# Patient Record
Sex: Female | Born: 1980 | Race: White | Hispanic: Yes | Marital: Married | Smoking: Never smoker
Health system: Southern US, Community
[De-identification: ages and names within clinical notes are randomized; demographics above are authoritative.]

## PROBLEM LIST (undated history)

## (undated) DIAGNOSIS — M069 Rheumatoid arthritis, unspecified: Secondary | ICD-10-CM

## (undated) DIAGNOSIS — N200 Calculus of kidney: Secondary | ICD-10-CM

## (undated) DIAGNOSIS — M1990 Unspecified osteoarthritis, unspecified site: Secondary | ICD-10-CM

## (undated) HISTORY — PX: TUBAL LIGATION: SHX77

## (undated) HISTORY — DX: Rheumatoid arthritis, unspecified: M06.9

---

## 2006-04-13 ENCOUNTER — Ambulatory Visit: Payer: Self-pay | Admitting: Nurse Practitioner

## 2006-04-14 ENCOUNTER — Ambulatory Visit: Payer: Self-pay | Admitting: Nurse Practitioner

## 2006-04-15 ENCOUNTER — Ambulatory Visit: Payer: Self-pay | Admitting: *Deleted

## 2006-04-25 ENCOUNTER — Ambulatory Visit: Payer: Self-pay | Admitting: Nurse Practitioner

## 2006-05-03 ENCOUNTER — Ambulatory Visit: Payer: Self-pay | Admitting: Nurse Practitioner

## 2006-05-03 ENCOUNTER — Encounter (INDEPENDENT_AMBULATORY_CARE_PROVIDER_SITE_OTHER): Payer: Self-pay | Admitting: Nurse Practitioner

## 2006-07-04 ENCOUNTER — Ambulatory Visit: Payer: Self-pay | Admitting: Nurse Practitioner

## 2006-09-26 ENCOUNTER — Ambulatory Visit: Payer: Self-pay | Admitting: Nurse Practitioner

## 2006-11-30 ENCOUNTER — Encounter (INDEPENDENT_AMBULATORY_CARE_PROVIDER_SITE_OTHER): Payer: Self-pay | Admitting: *Deleted

## 2007-07-13 ENCOUNTER — Emergency Department (HOSPITAL_COMMUNITY): Admission: EM | Admit: 2007-07-13 | Discharge: 2007-07-13 | Payer: Self-pay | Admitting: Family Medicine

## 2007-10-05 ENCOUNTER — Ambulatory Visit: Payer: Self-pay | Admitting: Internal Medicine

## 2008-07-06 ENCOUNTER — Emergency Department (HOSPITAL_COMMUNITY): Admission: EM | Admit: 2008-07-06 | Discharge: 2008-07-06 | Payer: Self-pay | Admitting: Family Medicine

## 2008-12-11 ENCOUNTER — Emergency Department (HOSPITAL_COMMUNITY): Admission: EM | Admit: 2008-12-11 | Discharge: 2008-12-11 | Payer: Self-pay | Admitting: Family Medicine

## 2008-12-30 ENCOUNTER — Ambulatory Visit: Payer: Self-pay | Admitting: Internal Medicine

## 2008-12-30 ENCOUNTER — Encounter (INDEPENDENT_AMBULATORY_CARE_PROVIDER_SITE_OTHER): Payer: Self-pay | Admitting: Internal Medicine

## 2008-12-30 LAB — CONVERTED CEMR LAB
Albumin: 4.8 g/dL (ref 3.5–5.2)
Alkaline Phosphatase: 48 units/L (ref 39–117)
Anti Nuclear Antibody(ANA): POSITIVE — AB
Basophils Absolute: 0 10*3/uL (ref 0.0–0.1)
CO2: 22 meq/L (ref 19–32)
Calcium: 9.6 mg/dL (ref 8.4–10.5)
Eosinophils Relative: 1 % (ref 0–5)
Glucose, Bld: 100 mg/dL — ABNORMAL HIGH (ref 70–99)
HCT: 39.2 % (ref 36.0–46.0)
Lymphs Abs: 2.4 10*3/uL (ref 0.7–4.0)
MCV: 88.7 fL (ref 78.0–100.0)
Neutro Abs: 4.9 10*3/uL (ref 1.7–7.7)
Platelets: 243 10*3/uL (ref 150–400)
Potassium: 4.1 meq/L (ref 3.5–5.3)
Rhuematoid fact SerPl-aCnc: 64 intl units/mL — ABNORMAL HIGH (ref 0–20)
Total Bilirubin: 0.4 mg/dL (ref 0.3–1.2)
Total Protein: 7.7 g/dL (ref 6.0–8.3)
WBC: 7.9 10*3/uL (ref 4.0–10.5)

## 2009-01-07 ENCOUNTER — Ambulatory Visit (HOSPITAL_COMMUNITY): Admission: RE | Admit: 2009-01-07 | Discharge: 2009-01-07 | Payer: Self-pay | Admitting: Diagnostic Radiology

## 2009-03-21 ENCOUNTER — Emergency Department (HOSPITAL_COMMUNITY): Admission: EM | Admit: 2009-03-21 | Discharge: 2009-03-21 | Payer: Self-pay | Admitting: Emergency Medicine

## 2009-03-21 ENCOUNTER — Emergency Department (HOSPITAL_COMMUNITY): Admission: EM | Admit: 2009-03-21 | Discharge: 2009-03-22 | Payer: Self-pay | Admitting: Emergency Medicine

## 2009-03-24 ENCOUNTER — Encounter (INDEPENDENT_AMBULATORY_CARE_PROVIDER_SITE_OTHER): Payer: Self-pay | Admitting: Family Medicine

## 2009-03-24 ENCOUNTER — Ambulatory Visit: Payer: Self-pay | Admitting: Internal Medicine

## 2009-03-24 LAB — CONVERTED CEMR LAB
ANA Titer 1: NEGATIVE
Anti Nuclear Antibody(ANA): POSITIVE — AB
Rhuematoid fact SerPl-aCnc: 71 intl units/mL — ABNORMAL HIGH (ref 0–20)
Sed Rate: 36 mm/hr — ABNORMAL HIGH (ref 0–22)
TSH: 1.848 microintl units/mL (ref 0.350–4.500)

## 2009-03-30 ENCOUNTER — Emergency Department (HOSPITAL_COMMUNITY): Admission: EM | Admit: 2009-03-30 | Discharge: 2009-03-30 | Payer: Self-pay | Admitting: Emergency Medicine

## 2009-04-24 ENCOUNTER — Ambulatory Visit: Payer: Self-pay | Admitting: Internal Medicine

## 2009-05-12 ENCOUNTER — Ambulatory Visit: Payer: Self-pay | Admitting: Internal Medicine

## 2009-05-12 ENCOUNTER — Encounter (INDEPENDENT_AMBULATORY_CARE_PROVIDER_SITE_OTHER): Payer: Self-pay | Admitting: Family Medicine

## 2009-05-12 LAB — CONVERTED CEMR LAB
Basophils Relative: 0 % (ref 0–1)
Eosinophils Absolute: 0.1 10*3/uL (ref 0.0–0.7)
Eosinophils Relative: 1 % (ref 0–5)
HCT: 37.8 % (ref 36.0–46.0)
Lymphocytes Relative: 23 % (ref 12–46)
MCV: 92 fL (ref 78.0–100.0)
Neutro Abs: 5.5 10*3/uL (ref 1.7–7.7)
Platelets: 198 10*3/uL (ref 150–400)
RBC: 4.11 M/uL (ref 3.87–5.11)
WBC: 7.7 10*3/uL (ref 4.0–10.5)

## 2009-05-27 ENCOUNTER — Ambulatory Visit: Payer: Self-pay | Admitting: Internal Medicine

## 2009-06-09 ENCOUNTER — Ambulatory Visit: Payer: Self-pay | Admitting: Internal Medicine

## 2009-06-10 ENCOUNTER — Encounter (INDEPENDENT_AMBULATORY_CARE_PROVIDER_SITE_OTHER): Payer: Self-pay | Admitting: Family Medicine

## 2009-06-10 LAB — CONVERTED CEMR LAB
Basophils Absolute: 0 10*3/uL (ref 0.0–0.1)
Basophils Relative: 0 % (ref 0–1)
Eosinophils Absolute: 0.1 10*3/uL (ref 0.0–0.7)
HCT: 35 % — ABNORMAL LOW (ref 36.0–46.0)
Lymphocytes Relative: 27 % (ref 12–46)
Lymphs Abs: 2.1 10*3/uL (ref 0.7–4.0)
MCHC: 32.6 g/dL (ref 30.0–36.0)
Neutro Abs: 5.1 10*3/uL (ref 1.7–7.7)
Neutrophils Relative %: 66 % (ref 43–77)
RDW: 13.3 % (ref 11.5–15.5)

## 2009-06-27 ENCOUNTER — Encounter (INDEPENDENT_AMBULATORY_CARE_PROVIDER_SITE_OTHER): Payer: Self-pay | Admitting: Family Medicine

## 2009-06-27 ENCOUNTER — Ambulatory Visit: Payer: Self-pay | Admitting: Internal Medicine

## 2009-06-27 LAB — CONVERTED CEMR LAB
ALT: 8 units/L (ref 0–35)
Alkaline Phosphatase: 40 units/L (ref 39–117)
BUN: 11 mg/dL (ref 6–23)
Basophils Absolute: 0 10*3/uL (ref 0.0–0.1)
CO2: 23 meq/L (ref 19–32)
Chloride: 106 meq/L (ref 96–112)
Eosinophils Absolute: 0.1 10*3/uL (ref 0.0–0.7)
Glucose, Bld: 75 mg/dL (ref 70–99)
Lymphocytes Relative: 33 % (ref 12–46)
MCV: 90.4 fL (ref 78.0–100.0)
Neutro Abs: 4.2 10*3/uL (ref 1.7–7.7)
Potassium: 3.6 meq/L (ref 3.5–5.3)
RBC: 4.07 M/uL (ref 3.87–5.11)
RDW: 13 % (ref 11.5–15.5)
Sed Rate: 15 mm/hr (ref 0–22)
Sodium: 142 meq/L (ref 135–145)
Total Bilirubin: 0.4 mg/dL (ref 0.3–1.2)
Total Protein: 7.1 g/dL (ref 6.0–8.3)
UIBC: 263 ug/dL
WBC: 7.1 10*3/uL (ref 4.0–10.5)

## 2009-08-12 ENCOUNTER — Ambulatory Visit: Payer: Self-pay | Admitting: Family Medicine

## 2009-08-27 ENCOUNTER — Ambulatory Visit (HOSPITAL_COMMUNITY): Admission: RE | Admit: 2009-08-27 | Discharge: 2009-08-27 | Payer: Self-pay | Admitting: Family Medicine

## 2009-09-02 ENCOUNTER — Ambulatory Visit: Payer: Self-pay | Admitting: Internal Medicine

## 2009-09-02 ENCOUNTER — Encounter (INDEPENDENT_AMBULATORY_CARE_PROVIDER_SITE_OTHER): Payer: Self-pay | Admitting: Family Medicine

## 2009-09-02 LAB — CONVERTED CEMR LAB
Eosinophils Relative: 1 % (ref 0–5)
HCT: 40.3 % (ref 36.0–46.0)
HDL: 56 mg/dL (ref >39)
LDL Cholesterol: 63 mg/dL (ref 0–99)
Monocytes Relative: 6 % (ref 3–12)
Neutro Abs: 6 10*3/uL (ref 1.7–7.7)
Neutrophils Relative %: 73 % (ref 43–77)
Total CHOL/HDL Ratio: 2.3
Triglycerides: 44 mg/dL (ref <150)

## 2009-09-05 ENCOUNTER — Ambulatory Visit (HOSPITAL_COMMUNITY): Admission: RE | Admit: 2009-09-05 | Discharge: 2009-09-05 | Payer: Self-pay | Admitting: Family Medicine

## 2009-10-20 ENCOUNTER — Ambulatory Visit: Payer: Self-pay | Admitting: Internal Medicine

## 2009-10-20 ENCOUNTER — Encounter (INDEPENDENT_AMBULATORY_CARE_PROVIDER_SITE_OTHER): Payer: Self-pay | Admitting: Family Medicine

## 2009-10-20 LAB — CONVERTED CEMR LAB
Basophils Absolute: 0 10*3/uL (ref 0.0–0.1)
Basophils Relative: 0 % (ref 0–1)
Eosinophils Absolute: 0 10*3/uL (ref 0.0–0.7)
Hemoglobin: 12.6 g/dL (ref 12.0–15.0)
Lymphocytes Relative: 27 % (ref 12–46)
Lymphs Abs: 1.9 10*3/uL (ref 0.7–4.0)
MCV: 89.8 fL (ref 78.0–100.0)
Monocytes Absolute: 0.5 10*3/uL (ref 0.1–1.0)
Neutro Abs: 4.8 10*3/uL (ref 1.7–7.7)
Neutrophils Relative %: 66 % (ref 43–77)
RBC: 4.12 M/uL (ref 3.87–5.11)
RDW: 12.8 % (ref 11.5–15.5)
Sed Rate: 13 mm/hr (ref 0–22)
WBC: 7.2 10*3/uL (ref 4.0–10.5)

## 2010-03-12 ENCOUNTER — Ambulatory Visit: Payer: Self-pay | Admitting: Obstetrics and Gynecology

## 2010-03-24 ENCOUNTER — Encounter: Payer: Self-pay | Admitting: Family Medicine

## 2010-03-24 ENCOUNTER — Ambulatory Visit (HOSPITAL_COMMUNITY)
Admission: RE | Admit: 2010-03-24 | Discharge: 2010-03-24 | Payer: Self-pay | Source: Home / Self Care | Attending: Family Medicine | Admitting: Family Medicine

## 2010-04-02 ENCOUNTER — Ambulatory Visit
Admission: RE | Admit: 2010-04-02 | Discharge: 2010-04-02 | Payer: Self-pay | Source: Home / Self Care | Attending: Obstetrics and Gynecology | Admitting: Obstetrics and Gynecology

## 2010-04-02 ENCOUNTER — Encounter: Payer: Self-pay | Admitting: Obstetrics & Gynecology

## 2010-04-02 LAB — CONVERTED CEMR LAB: Anticardiolipin IgA: 5 (ref <22)

## 2010-04-03 ENCOUNTER — Encounter: Payer: Self-pay | Admitting: Obstetrics & Gynecology

## 2010-04-03 LAB — CONVERTED CEMR LAB: GC Probe Amp, Genital: NEGATIVE

## 2010-04-04 ENCOUNTER — Other Ambulatory Visit: Payer: Self-pay | Admitting: Obstetrics & Gynecology

## 2010-04-04 DIAGNOSIS — Z3689 Encounter for other specified antenatal screening: Secondary | ICD-10-CM

## 2010-04-06 LAB — POCT URINALYSIS DIPSTICK
Bilirubin Urine: NEGATIVE
Nitrite: NEGATIVE
Protein, ur: NEGATIVE mg/dL
Specific Gravity, Urine: 1.025 (ref 1.005–1.030)

## 2010-04-08 ENCOUNTER — Ambulatory Visit (HOSPITAL_COMMUNITY): Admission: RE | Admit: 2010-04-08 | Payer: Self-pay | Source: Home / Self Care | Admitting: Obstetrics & Gynecology

## 2010-04-15 ENCOUNTER — Ambulatory Visit (HOSPITAL_COMMUNITY)
Admission: RE | Admit: 2010-04-15 | Discharge: 2010-04-15 | Disposition: A | Discharge status: Home / Self Care | Payer: Self-pay | Source: Ambulatory Visit | Attending: Family Medicine | Admitting: Family Medicine

## 2010-04-23 ENCOUNTER — Other Ambulatory Visit: Payer: Self-pay

## 2010-04-23 ENCOUNTER — Encounter (INDEPENDENT_AMBULATORY_CARE_PROVIDER_SITE_OTHER): Payer: Self-pay | Admitting: *Deleted

## 2010-04-23 DIAGNOSIS — M069 Rheumatoid arthritis, unspecified: Secondary | ICD-10-CM

## 2010-04-23 DIAGNOSIS — O34219 Maternal care for unspecified type scar from previous cesarean delivery: Secondary | ICD-10-CM

## 2010-04-23 LAB — POCT URINALYSIS DIPSTICK
Hgb urine dipstick: NEGATIVE
Ketones, ur: NEGATIVE mg/dL
Specific Gravity, Urine: 1.02 (ref 1.005–1.030)
Urine Glucose, Fasting: NEGATIVE mg/dL
Urobilinogen, UA: 0.2 mg/dL (ref 0.0–1.0)

## 2010-05-05 ENCOUNTER — Encounter (HOSPITAL_COMMUNITY): Payer: Self-pay

## 2010-05-05 ENCOUNTER — Ambulatory Visit (HOSPITAL_COMMUNITY)
Admission: RE | Admit: 2010-05-05 | Discharge: 2010-05-05 | Disposition: A | Discharge status: Home / Self Care | Payer: Self-pay | Source: Ambulatory Visit | Attending: Obstetrics & Gynecology | Admitting: Obstetrics & Gynecology

## 2010-05-05 ENCOUNTER — Other Ambulatory Visit: Payer: Self-pay | Admitting: Obstetrics & Gynecology

## 2010-05-05 DIAGNOSIS — Z363 Encounter for antenatal screening for malformations: Secondary | ICD-10-CM | POA: Insufficient documentation

## 2010-05-05 DIAGNOSIS — M069 Rheumatoid arthritis, unspecified: Secondary | ICD-10-CM

## 2010-05-05 DIAGNOSIS — Z3689 Encounter for other specified antenatal screening: Secondary | ICD-10-CM

## 2010-05-05 DIAGNOSIS — Z1389 Encounter for screening for other disorder: Secondary | ICD-10-CM | POA: Insufficient documentation

## 2010-05-05 DIAGNOSIS — O358XX0 Maternal care for other (suspected) fetal abnormality and damage, not applicable or unspecified: Secondary | ICD-10-CM | POA: Insufficient documentation

## 2010-05-14 ENCOUNTER — Other Ambulatory Visit: Payer: Self-pay

## 2010-05-14 ENCOUNTER — Other Ambulatory Visit: Payer: Self-pay | Admitting: Family Medicine

## 2010-05-14 DIAGNOSIS — M069 Rheumatoid arthritis, unspecified: Secondary | ICD-10-CM

## 2010-05-14 DIAGNOSIS — O34219 Maternal care for unspecified type scar from previous cesarean delivery: Secondary | ICD-10-CM

## 2010-05-14 LAB — POCT URINALYSIS DIPSTICK
Nitrite: NEGATIVE
Urine Glucose, Fasting: NEGATIVE mg/dL
pH: 8 (ref 5.0–8.0)

## 2010-05-25 LAB — POCT URINALYSIS DIPSTICK
Bilirubin Urine: NEGATIVE
Glucose, UA: NEGATIVE mg/dL
Ketones, ur: NEGATIVE mg/dL
Nitrite: NEGATIVE
Protein, ur: NEGATIVE mg/dL
Urobilinogen, UA: 0.2 mg/dL (ref 0.0–1.0)
pH: 5.5 (ref 5.0–8.0)

## 2010-05-31 LAB — CBC
Hemoglobin: 11.7 g/dL — ABNORMAL LOW (ref 12.0–15.0)
Hemoglobin: 12.5 g/dL (ref 12.0–15.0)
MCHC: 34.5 g/dL (ref 30.0–36.0)
MCHC: 34.1 g/dL (ref 30.0–36.0)
MCV: 91 fL (ref 78.0–100.0)
Platelets: 173 10*3/uL (ref 150–400)
RBC: 3.71 MIL/uL — ABNORMAL LOW (ref 3.87–5.11)
WBC: 13.3 10*3/uL — ABNORMAL HIGH (ref 4.0–10.5)

## 2010-05-31 LAB — POCT I-STAT, CHEM 8
BUN: 6 mg/dL (ref 6–23)
Chloride: 104 mEq/L (ref 96–112)
HCT: 37 % (ref 36.0–46.0)
Sodium: 137 mEq/L (ref 135–145)

## 2010-05-31 LAB — POCT RAPID STREP A (OFFICE): Streptococcus, Group A Screen (Direct): POSITIVE — AB

## 2010-05-31 LAB — POCT URINALYSIS DIP (DEVICE)
Bilirubin Urine: NEGATIVE
Glucose, UA: NEGATIVE mg/dL
Ketones, ur: 80 mg/dL — AB
Ketones, ur: NEGATIVE mg/dL
Nitrite: NEGATIVE
Nitrite: NEGATIVE
Protein, ur: NEGATIVE mg/dL
Specific Gravity, Urine: 1.01 (ref 1.005–1.030)
Urobilinogen, UA: 1 mg/dL (ref 0.0–1.0)

## 2010-05-31 LAB — COMPREHENSIVE METABOLIC PANEL
CO2: 23 mEq/L (ref 19–32)
GFR calc non Af Amer: >60 mL/min (ref >60)
Glucose, Bld: 100 mg/dL — ABNORMAL HIGH (ref 70–99)
Potassium: 3.6 mEq/L (ref 3.5–5.1)
Sodium: 131 mEq/L — ABNORMAL LOW (ref 135–145)

## 2010-05-31 LAB — DIFFERENTIAL
Basophils Absolute: 0 10*3/uL (ref 0.0–0.1)
Basophils Absolute: 0 10*3/uL (ref 0.0–0.1)
Basophils Relative: 0 % (ref 0–1)
Basophils Relative: 0 % (ref 0–1)
Eosinophils Absolute: 0.1 10*3/uL (ref 0.0–0.7)
Eosinophils Absolute: 0.6 10*3/uL (ref 0.0–0.7)
Eosinophils Relative: 6 % — ABNORMAL HIGH (ref 0–5)
Lymphs Abs: 0.8 10*3/uL (ref 0.7–4.0)
Lymphs Abs: 1.1 10*3/uL (ref 0.7–4.0)
Monocytes Relative: 3 % (ref 3–12)
Neutro Abs: 11.9 10*3/uL — ABNORMAL HIGH (ref 1.7–7.7)
Neutro Abs: 7.2 10*3/uL (ref 1.7–7.7)
Neutrophils Relative %: 78 % — ABNORMAL HIGH (ref 43–77)

## 2010-05-31 LAB — POCT PREGNANCY, URINE
Preg Test, Ur: NEGATIVE
Preg Test, Ur: NEGATIVE

## 2010-05-31 LAB — LIPASE, BLOOD: Lipase: 25 U/L (ref 11–59)

## 2010-05-31 LAB — SEDIMENTATION RATE: Sed Rate: 40 mm/hr — ABNORMAL HIGH (ref 0–22)

## 2010-06-11 ENCOUNTER — Ambulatory Visit (HOSPITAL_COMMUNITY)
Admission: RE | Admit: 2010-06-11 | Discharge: 2010-06-11 | Disposition: A | Discharge status: Home / Self Care | Payer: Self-pay | Source: Ambulatory Visit | Attending: Family Medicine | Admitting: Family Medicine

## 2010-06-11 ENCOUNTER — Other Ambulatory Visit: Payer: Self-pay | Admitting: Obstetrics and Gynecology

## 2010-06-11 DIAGNOSIS — O34219 Maternal care for unspecified type scar from previous cesarean delivery: Secondary | ICD-10-CM

## 2010-06-11 DIAGNOSIS — Z3689 Encounter for other specified antenatal screening: Secondary | ICD-10-CM | POA: Insufficient documentation

## 2010-06-11 DIAGNOSIS — M069 Rheumatoid arthritis, unspecified: Secondary | ICD-10-CM

## 2010-06-11 DIAGNOSIS — Z331 Pregnant state, incidental: Secondary | ICD-10-CM

## 2010-06-11 LAB — POCT URINALYSIS DIP (DEVICE)
Glucose, UA: NEGATIVE mg/dL
Ketones, ur: NEGATIVE mg/dL
Protein, ur: NEGATIVE mg/dL
Specific Gravity, Urine: 1.025 (ref 1.005–1.030)
Urobilinogen, UA: 0.2 mg/dL (ref 0.0–1.0)

## 2010-06-16 ENCOUNTER — Ambulatory Visit (HOSPITAL_COMMUNITY): Payer: Self-pay

## 2010-06-19 LAB — POCT URINALYSIS DIP (DEVICE)
Glucose, UA: NEGATIVE mg/dL
Nitrite: NEGATIVE
Specific Gravity, Urine: >=1.03 (ref 1.005–1.030)
Urobilinogen, UA: 0.2 mg/dL (ref 0.0–1.0)

## 2010-06-19 LAB — POCT PREGNANCY, URINE: Preg Test, Ur: NEGATIVE

## 2010-06-25 ENCOUNTER — Other Ambulatory Visit: Payer: Self-pay

## 2010-06-25 ENCOUNTER — Other Ambulatory Visit: Payer: Self-pay | Admitting: Obstetrics & Gynecology

## 2010-06-29 ENCOUNTER — Other Ambulatory Visit: Payer: Self-pay

## 2010-06-30 LAB — POCT URINALYSIS DIP (DEVICE)
Bilirubin Urine: NEGATIVE
Glucose, UA: NEGATIVE mg/dL
Hgb urine dipstick: NEGATIVE
Nitrite: NEGATIVE
Urobilinogen, UA: 0.2 mg/dL (ref 0.0–1.0)

## 2010-07-09 DIAGNOSIS — O34219 Maternal care for unspecified type scar from previous cesarean delivery: Secondary | ICD-10-CM

## 2010-07-09 DIAGNOSIS — M069 Rheumatoid arthritis, unspecified: Secondary | ICD-10-CM

## 2010-07-09 DIAGNOSIS — Z331 Pregnant state, incidental: Secondary | ICD-10-CM

## 2010-07-15 ENCOUNTER — Inpatient Hospital Stay (HOSPITAL_COMMUNITY)
Admission: AD | Admit: 2010-07-15 | Discharge: 2010-07-15 | Disposition: A | Discharge status: Home / Self Care | Payer: Self-pay | Source: Ambulatory Visit | Attending: Obstetrics and Gynecology | Admitting: Obstetrics and Gynecology

## 2010-07-15 DIAGNOSIS — O99891 Other specified diseases and conditions complicating pregnancy: Secondary | ICD-10-CM | POA: Insufficient documentation

## 2010-07-15 DIAGNOSIS — M19019 Primary osteoarthritis, unspecified shoulder: Secondary | ICD-10-CM | POA: Insufficient documentation

## 2010-07-15 DIAGNOSIS — M25519 Pain in unspecified shoulder: Secondary | ICD-10-CM | POA: Insufficient documentation

## 2010-07-15 DIAGNOSIS — O9989 Other specified diseases and conditions complicating pregnancy, childbirth and the puerperium: Secondary | ICD-10-CM

## 2010-07-23 ENCOUNTER — Other Ambulatory Visit: Payer: Self-pay | Admitting: Obstetrics & Gynecology

## 2010-07-23 DIAGNOSIS — Z331 Pregnant state, incidental: Secondary | ICD-10-CM

## 2010-07-23 DIAGNOSIS — M069 Rheumatoid arthritis, unspecified: Secondary | ICD-10-CM

## 2010-07-23 DIAGNOSIS — O0930 Supervision of pregnancy with insufficient antenatal care, unspecified trimester: Secondary | ICD-10-CM

## 2010-07-23 LAB — POCT URINALYSIS DIP (DEVICE)
Bilirubin Urine: NEGATIVE
Glucose, UA: NEGATIVE mg/dL
Ketones, ur: NEGATIVE mg/dL
Nitrite: NEGATIVE
pH: 6.5 (ref 5.0–8.0)

## 2010-08-06 ENCOUNTER — Other Ambulatory Visit: Payer: Self-pay | Admitting: Obstetrics & Gynecology

## 2010-08-06 DIAGNOSIS — Z331 Pregnant state, incidental: Secondary | ICD-10-CM

## 2010-08-06 DIAGNOSIS — O34219 Maternal care for unspecified type scar from previous cesarean delivery: Secondary | ICD-10-CM

## 2010-08-06 DIAGNOSIS — M069 Rheumatoid arthritis, unspecified: Secondary | ICD-10-CM

## 2010-08-06 LAB — POCT URINALYSIS DIP (DEVICE)
Bilirubin Urine: NEGATIVE
Glucose, UA: NEGATIVE mg/dL
Ketones, ur: NEGATIVE mg/dL
Protein, ur: NEGATIVE mg/dL
Specific Gravity, Urine: 1.025 (ref 1.005–1.030)

## 2010-08-13 ENCOUNTER — Other Ambulatory Visit: Payer: Self-pay | Admitting: Obstetrics and Gynecology

## 2010-08-13 DIAGNOSIS — O34219 Maternal care for unspecified type scar from previous cesarean delivery: Secondary | ICD-10-CM

## 2010-08-13 DIAGNOSIS — M069 Rheumatoid arthritis, unspecified: Secondary | ICD-10-CM

## 2010-08-13 DIAGNOSIS — Z331 Pregnant state, incidental: Secondary | ICD-10-CM

## 2010-08-13 LAB — POCT URINALYSIS DIP (DEVICE)
Bilirubin Urine: NEGATIVE
Glucose, UA: NEGATIVE mg/dL
Ketones, ur: NEGATIVE mg/dL
Specific Gravity, Urine: 1.02 (ref 1.005–1.030)
Urobilinogen, UA: 0.2 mg/dL (ref 0.0–1.0)

## 2010-08-19 ENCOUNTER — Other Ambulatory Visit: Payer: Self-pay

## 2010-08-19 DIAGNOSIS — Z0189 Encounter for other specified special examinations: Secondary | ICD-10-CM

## 2010-08-27 ENCOUNTER — Other Ambulatory Visit: Payer: Self-pay | Admitting: Obstetrics & Gynecology

## 2010-08-27 DIAGNOSIS — O34219 Maternal care for unspecified type scar from previous cesarean delivery: Secondary | ICD-10-CM

## 2010-08-27 LAB — POCT URINALYSIS DIP (DEVICE)
Bilirubin Urine: NEGATIVE
Glucose, UA: NEGATIVE mg/dL
Ketones, ur: NEGATIVE mg/dL
Nitrite: NEGATIVE
pH: 6 (ref 5.0–8.0)

## 2010-09-02 ENCOUNTER — Other Ambulatory Visit: Payer: Self-pay

## 2010-09-02 DIAGNOSIS — Z0189 Encounter for other specified special examinations: Secondary | ICD-10-CM

## 2010-09-08 ENCOUNTER — Inpatient Hospital Stay (HOSPITAL_COMMUNITY)
Admission: EM | Admit: 2010-09-08 | Discharge: 2010-09-12 | DRG: 765 | Disposition: A | Discharge status: Home / Self Care | Payer: Medicaid Other | Source: Ambulatory Visit | Attending: Obstetrics and Gynecology | Admitting: Obstetrics and Gynecology

## 2010-09-08 DIAGNOSIS — O4290 Premature rupture of membranes, unspecified as to length of time between rupture and onset of labor, unspecified weeks of gestation: Principal | ICD-10-CM | POA: Diagnosis present

## 2010-09-08 DIAGNOSIS — O34219 Maternal care for unspecified type scar from previous cesarean delivery: Secondary | ICD-10-CM | POA: Diagnosis present

## 2010-09-08 DIAGNOSIS — Z302 Encounter for sterilization: Secondary | ICD-10-CM

## 2010-09-09 ENCOUNTER — Other Ambulatory Visit: Payer: Self-pay | Admitting: Obstetrics and Gynecology

## 2010-09-09 DIAGNOSIS — Z302 Encounter for sterilization: Secondary | ICD-10-CM

## 2010-09-09 DIAGNOSIS — O34219 Maternal care for unspecified type scar from previous cesarean delivery: Secondary | ICD-10-CM

## 2010-09-09 DIAGNOSIS — O0940 Supervision of pregnancy with grand multiparity, unspecified trimester: Secondary | ICD-10-CM

## 2010-09-09 DIAGNOSIS — O4290 Premature rupture of membranes, unspecified as to length of time between rupture and onset of labor, unspecified weeks of gestation: Secondary | ICD-10-CM

## 2010-09-09 LAB — URINALYSIS, ROUTINE W REFLEX MICROSCOPIC
Nitrite: NEGATIVE
Specific Gravity, Urine: 1.02 (ref 1.005–1.030)
Urobilinogen, UA: 0.2 mg/dL (ref 0.0–1.0)
pH: 6.5 (ref 5.0–8.0)

## 2010-09-09 LAB — URINE MICROSCOPIC-ADD ON

## 2010-09-09 LAB — CBC
HCT: 34.2 % — ABNORMAL LOW (ref 36.0–46.0)
MCH: 29.2 pg (ref 26.0–34.0)
MCV: 88.4 fL (ref 78.0–100.0)
RBC: 3.87 MIL/uL (ref 3.87–5.11)
WBC: 12.9 10*3/uL — ABNORMAL HIGH (ref 4.0–10.5)

## 2010-09-09 LAB — WET PREP, GENITAL: Yeast Wet Prep HPF POC: NONE SEEN

## 2010-09-09 LAB — GC/CHLAMYDIA PROBE AMP, GENITAL
Chlamydia, DNA Probe: NEGATIVE
GC Probe Amp, Genital: NEGATIVE

## 2010-09-10 LAB — CBC
Hemoglobin: 9.3 g/dL — ABNORMAL LOW (ref 12.0–15.0)
RBC: 3.16 MIL/uL — ABNORMAL LOW (ref 3.87–5.11)
WBC: 13.5 10*3/uL — ABNORMAL HIGH (ref 4.0–10.5)

## 2010-09-14 NOTE — Op Note (Addendum)
Patricia Tate, Patricia Tate    ACCOUNT NO.:  [id number]  MEDICAL RECORD NO.:  [id number]  LOCATION:  9136                          FACILITY:  [facility]  PHYSICIAN:  Catalina Antigua, MD     DATE OF BIRTH:  April 23, 1980  DATE OF PROCEDURE:  09/09/2010 DATE OF DISCHARGE:                              OPERATIVE REPORT   PREOPERATIVE DIAGNOSES: 1. Intrauterine pregnancy at 36 weeks and 4 days. 2. Preterm premature rupture of membranes. 3. Multiparity and desires permanent sterilization. 4. Previous cesarean section x2.  POSTOPERATIVE DIAGNOSES: 1. Intrauterine pregnancy at 36 weeks and 4 days. 2. Preterm premature rupture of membranes. 3. Multiparity and desires permanent sterilization. 4. Previous cesarean section x2.  PROCEDURE:  Repeat low-transverse cesarean section with bilateral tubal ligation via modified Pomeroy method with Pfannenstiel incision.  SURGEON:  Catalina Antigua, MD and Maryelizabeth Kaufmann, MD. ANESTHESIA:  Spinal.  FINDINGS:  A viable female infant in vertex presentation with clear amniotic fluid delivered by vacuum assistance.  Apgars were 8 and 9, weight was 5 pounds 14 ounces.  Normal uterus and adnexa bilaterally.  SPECIMENS:  Placenta that was sent to Labor and Delivery and bilateral tubal segments sent to pathology.  IV FLUIDS:  1500 mL.  URINE OUTPUT:  200 mL of clear urine at the end of procedure.  ESTIMATED BLOOD LOSS:  600 mL.  COMPLICATIONS:  None immediate.  INDICATIONS:  This is a 31 year old gravida 3, para 2-0-0-2 with history of previous C-section x2, who presented with a complaint of rupture of membranes.  Sterile vaginal exam showed some mild pooling, ferning was positive.  The patient was fingertip, thick, and high.  Fetal heart rate tracing was reactive and reassuring.  She was not having any contractions.  The patient was n.p.o. at 10 o'clock p.m. and she presented at midnight.  Her past medical history is otherwise notable for  rheumatoid arthritis for which she is on Plaquenil and prednisone.  Due to the patient's previous history of C-section x2 and preterm premature rupture of membranes, the patient was consented for repeat C-section with bilateral tubal ligation.  Risks, benefits, and alternatives were discussed with the patient, she consented with the assistance of an interpreter.  She did receive GBS prophylaxis perioperatively as well as antibiotics.  PROCEDURE NOTE IN DETAIL:  The patient was taken back to the operative suite where spinal anesthesia was placed.  After anesthesia was found to be adequate, the patient was placed in a dorsal supine position with a leftward tilt.  The patient was prepped and draped in normal sterile fashion.  Anesthesia was again tested and was found to be adequate.  A Pfannenstiel skin incision was then made with a scalpel and carried down through to the underlying fascia.  The fascia was then incised in the midline.  The fascial incision was then extended laterally with the Mayo scissors.  Superior aspect of the fascial incision was then grasped with a Kocher clamps, elevated, tented up, and the rectus muscles were then dissected off bluntly.  Attention was then turned inferiorly which in similar fashion, the inferior edge was grasped with Kocher clamps, elevated, tented up, and the rectus muscles were then dissected off bluntly.  The rectus muscles were then separated in the midline  manually.  The peritoneum was then entered bluntly.  An Alexis O ring retractor was then inserted into the abdomen.  The vesicouterine peritoneum was then identified and entered sharply with the Metzenbaum scissors and the bladder flap was then created digitally.  Lower uterine segment was then incised in a transverse fashion with the scalpel.  The incision was extended manually.  The infant was found to be in vertex presentation with clear amniotic fluid.  Infant was delivered with  the assistance of vacuum assistance, otherwise atraumatically.  Mouth and nose were bulb suctioned.  Cord was cut and clamped and infant was handed off to awaiting NICU.  Apgars were 8 and 9, weight was 5 pounds 14 ounces.  Placenta was delivered spontaneously intact with three- vessel cord.  The uterus was then cleared of all clots and debris.  The uterine incision was then repaired using an 0 Vicryl in a running locking fashion.  She did have some additional areas that needed 2 figure-of-eights for additional hemostasis, otherwise hemostasis was noted to be adequate.  Attention was then turned to bilateral adnexa wherein the left adnexa was brought to the incision.  The left fallopian tube was grasped with a Babcock clamp and in the isthmic region, using a modified Pomeroy method, it was ligated using a plain gut suture and additional hemostasis was obtained with the electrocautery.  Then in similar fashion, the left adnexa was returned to the abdomen.  The right adnexa was then brought to the incision, visualized.  The right fallopian tube was then grasped with a Babcock clamp, followed out to the fimbriae, and the right fallopian tube was then off to ligate using a modified Pomeroy method with plain gut.  An additional hemostasis was then obtained with the electrocautery.  Bilateral ovaries also appeared to be normal as well.  The Alexis O ring retractor was then removed. The peritoneum was then irrigated, cleared of all clots and debris.  The fascial incision was then closed using 0 Vicryl in a running fashion. Subcutaneous tissue was irrigated.  Hemostasis was adequate.  Skin was then closed with staples.  The patient tolerated the procedure well. Lap, needle, and sponge counts were correct x2.  The patient was taken back to recovery area in stable condition.    ______________________________ Maryelizabeth Kaufmann, MD   ______________________________ Catalina Antigua,  MD    LC/MEDQ  D:  09/09/2010  T:  09/09/2010  Job:  [id number]  Electronically Signed by Catalina Antigua  on 09/14/2010 01:20:02 PM Electronically Signed by Maryelizabeth Kaufmann MD on 09/22/2010 08:44:03 AM

## 2010-09-14 NOTE — Discharge Summary (Signed)
  NAMEDENETRIA, LUEVANOS    ACCOUNT NO.:  [id number]  MEDICAL RECORD NO.:  [id number]  LOCATION:  9136                          FACILITY:  [facility]  PHYSICIAN:  Catalina Antigua, MD     DATE OF BIRTH:  [birth year]  DATE OF ADMISSION:  09/08/2010 DATE OF DISCHARGE:  09/12/2010                              DISCHARGE SUMMARY   DISCHARGE DIAGNOSES:  Intrauterine pregnancy at 26 and 4 weeks, premature spontaneous rupture of membranes, rheumatoid arthritis.  PROCEDURES:  Low-transverse C-section, bilateral tubal ligation.  HOSPITAL COURSE:  A 30 year old female 8 and 4 admitted with premature rupture of membranes.  She desired bilateral tubal ligation as well as a C-section.  The patient was taken to the operating room.  A repeat low- transverse C-section was done as well as bilateral tubal ligation in a modified pomeroy method without any complications, minimal blood loss. A viable female was born with Apgar's of 8 and 9 with a weight of 5 and 14 ounces.  She did well postoperative day #1 with minimal pain.  Her abdomen was soft.  Fundus was firm.  Her vaginal bleeding steadily decreased.  She was continued on her prednisone and hydroxychloroquine for her rheumatoid arthritis.  By postoperative day #3, she was in a good state to be discharged home discharge.  DISCHARGE CONDITION:  Improved.  DISCHARGE FOLLOWUP:  Follow up with your PCP in 1 week.  Follow up with [facility] in 6 weeks for postpartum check.  She is breast-feeding.  DISCHARGE MEDICATIONS: 1. Vicodin 5/500 mg tabs 1-2 tablets every 4 hours p.r.n. pain. 2. Ibuprofen 600 mg tabs 1 p.o. q.6 h. p.r.n. pain. 3. Prednisone taper 20 mg tabs 1 p.o. daily for 2 weeks, then switch     to 15 mg daily until your clinic appointment at which time your     medications will be adjusted accordingly.  The patient will be     followed up by [facility] on Tuesday, September 15, 2010 for staple      removal.    ______________________________ Edd Arbour, MD   ______________________________ Catalina Antigua, MD   JO/MEDQ  D:  09/12/2010  T:  09/12/2010  Job:  [id number]  Electronically Signed by Edd Arbour MD on 09/14/2010 08:51:16 AM Electronically Signed by Catalina Antigua  on 09/14/2010 02:22:14 PM

## 2010-09-15 ENCOUNTER — Ambulatory Visit (HOSPITAL_COMMUNITY): Admission: RE | Admit: 2010-09-15 | Payer: Self-pay | Source: Ambulatory Visit

## 2010-09-21 ENCOUNTER — Encounter (HOSPITAL_COMMUNITY): Payer: Medicaid Other | Attending: Obstetrics and Gynecology

## 2010-09-21 ENCOUNTER — Inpatient Hospital Stay (HOSPITAL_COMMUNITY): Admission: RE | Admit: 2010-09-21 | Payer: Self-pay | Source: Ambulatory Visit

## 2010-09-21 ENCOUNTER — Other Ambulatory Visit (HOSPITAL_COMMUNITY): Payer: Self-pay

## 2010-09-23 ENCOUNTER — Encounter (HOSPITAL_COMMUNITY): Payer: Self-pay | Admitting: *Deleted

## 2010-09-23 ENCOUNTER — Inpatient Hospital Stay (HOSPITAL_COMMUNITY)
Admission: EM | Admit: 2010-09-23 | Discharge: 2010-09-23 | Disposition: A | Discharge status: Home / Self Care | Payer: Self-pay | Source: Ambulatory Visit | Attending: Obstetrics and Gynecology | Admitting: Obstetrics and Gynecology

## 2010-09-23 DIAGNOSIS — N939 Abnormal uterine and vaginal bleeding, unspecified: Secondary | ICD-10-CM

## 2010-09-23 HISTORY — DX: Unspecified osteoarthritis, unspecified site: M19.90

## 2010-09-23 LAB — CBC
HCT: 37.7 % (ref 36.0–46.0)
Hemoglobin: 12.4 g/dL (ref 12.0–15.0)
MCH: 29.2 pg (ref 26.0–34.0)
MCHC: 32.9 g/dL (ref 30.0–36.0)
MCV: 88.7 fL (ref 78.0–100.0)
Platelets: 249 10*3/uL (ref 150–400)
RBC: 4.25 MIL/uL (ref 3.87–5.11)
RDW: 15.4 % (ref 11.5–15.5)
WBC: 11.6 10*3/uL — ABNORMAL HIGH (ref 4.0–10.5)

## 2010-09-23 NOTE — ED Notes (Signed)
Dr. Campbell in to evaluate pt

## 2010-09-23 NOTE — Progress Notes (Signed)
09/23/2010 Patricia Tate   Interpreter  I assisted Patricia Pikes RN with discharge.

## 2010-09-23 NOTE — ED Notes (Signed)
Pt. States she has had C/S X 3. Had BTL with recent C/S. States her bleeding had almost stopped until now. States she is breast feeding and pumping, but states the past 3 days, "milk is not flowing." States she gets only about 2 oz. and baby is not satisfied. She is having to supplement. States she thinks it could be because of her arthritis medication. States her sister was taking the same med. and her milk supply decreased as well.

## 2010-09-23 NOTE — Progress Notes (Signed)
Pt states she had a cesarean section on 6-27. Has had no problems until this am at 0300 had a gush of blood. Had another at 0700 and 1100 with moderate size clots. Pt is having no abdominal pain but does have a headache.

## 2010-09-23 NOTE — ED Provider Notes (Signed)
Chief Complaint:  Vaginal Bleeding   Patricia Tate is  30 y.o. [phone] s/p RLTCS with BTL @36 + wks due to pprom 2 weeks ago on 09/09/2010 presenting with vaginal bleeding for one day.  She states she awoke this am at 0500 "soaked in blood," changed clothes, only to pass another clot at 0700 and 1000.  She states that it is getting slightly better.  She is currently breastfeeding but states having decreased milk production.  She is mildly constipated controlled on colace.  She denies any LOC with ?dizziness.  She does note a headache with some eye pain.    After surgery her bleeding stopped for 5 days then returned this am.   Obstetrical/Gynecological History: OB History    Grav Para Term Preterm Abortions TAB SAB Ect Mult Living   4 3 1 2      3       Past Medical History: Past Medical History  Diagnosis Date  . Arthritis     Past Surgical History: Past Surgical History  Procedure Date  . Cesarean section 09/09/10  . Tubal ligation 09/09/10    Family History: No family history on file.  Social History: History  Substance Use Topics  . Smoking status: Never Smoker   . Smokeless tobacco: Not on file  . Alcohol Use: No    Allergies: No Known Allergies  Prescriptions prior to admission  Medication Sig Dispense Refill  . docusate sodium (COLACE) 100 MG capsule Take 100 mg by mouth 2 (two) times daily as needed.        Marland Kitchen HYDROcodone-acetaminophen (VICODIN) 5-500 MG per tablet Take 1 tablet by mouth every 4 (four) hours as needed. For pain        . hydroxychloroquine (PLAQUENIL) 200 MG tablet Take 200 mg by mouth 3 (three) times daily.        Marland Kitchen ibuprofen (ADVIL,MOTRIN) 200 MG tablet Take 600 mg by mouth every 12 (twelve) hours as needed. For pain        . predniSONE (DELTASONE) 10 MG tablet Take 15 mg by mouth daily. As needed for arthritis      . Prenatal Multivit-Min-Fe-FA (PRENATAL PO) Take 1 tablet by mouth daily.          Review of Systems - Negative except per  HPI  Physical Exam   Blood pressure 110/73, pulse 85, temperature 98.3 F (36.8 C), temperature source Oral, resp. rate 16, height 5' 1.5" (1.562 m), weight 70.943 kg (156 lb 6.4 oz), currently breastfeeding.  General: General appearance - alert, well appearing, and in no distress Chest - clear to auscultation, no wheezes, rales or rhonchi, symmetric air entry Heart - normal rate, regular rhythm, normal S1, S2, no murmurs, rubs, clicks or gallops Abdomen - soft, nontender, nondistended, no masses or organomegaly Incision c/d/i well healed with steristrips in place Pelvic - VAGINA: vaginal bleeding notable in the vault-mod, CERVIX: os open with blood at the os, nontender, no lesions visible, normal bimanual exam Extremities - peripheral pulses normal, no pedal edema, no clubbing or cyanosis   Labs: No results found for this or any previous visit (from the past 24 hour(s)). Imaging Studies:  No results found.   Assessment: [id number] s/p RLTCS with BTL presenting with vaginal bleeding, currently suggestive of normal physiologic bleeding.    Plan:  Check CBC, ER warnings discussed with the patient and pending CBC results pt may receive iron po.  Keep appointment in clinic for postop check.   Rhoderick Farrel

## 2010-09-23 NOTE — ED Notes (Signed)
Patricia Tate in for interpretation.

## 2010-09-23 NOTE — Progress Notes (Signed)
09/23/2010 Livian Vanderbeck   Interpreter  I assisted Darl Pikes RN with MAU visit.

## 2010-09-28 ENCOUNTER — Encounter (HOSPITAL_COMMUNITY): Admission: RE | Payer: Self-pay | Source: Ambulatory Visit

## 2010-09-28 ENCOUNTER — Inpatient Hospital Stay (HOSPITAL_COMMUNITY): Admission: RE | Admit: 2010-09-28 | Payer: Self-pay | Source: Ambulatory Visit | Admitting: Obstetrics & Gynecology

## 2010-09-28 SURGERY — Surgical Case
Anesthesia: Spinal

## 2010-10-04 ENCOUNTER — Inpatient Hospital Stay (HOSPITAL_COMMUNITY): Admission: AD | Admit: 2010-10-04 | Payer: Self-pay | Source: Home / Self Care | Admitting: Obstetrics & Gynecology

## 2010-11-12 ENCOUNTER — Ambulatory Visit (INDEPENDENT_AMBULATORY_CARE_PROVIDER_SITE_OTHER): Payer: Self-pay | Admitting: Obstetrics and Gynecology

## 2010-11-12 ENCOUNTER — Encounter: Payer: Self-pay | Admitting: Obstetrics and Gynecology

## 2010-11-12 ENCOUNTER — Other Ambulatory Visit: Payer: Self-pay | Admitting: Obstetrics and Gynecology

## 2010-11-12 VITALS — BP 117/79 | HR 110 | Temp 97.6°F | Wt 156.1 lb

## 2010-11-12 DIAGNOSIS — M069 Rheumatoid arthritis, unspecified: Secondary | ICD-10-CM | POA: Insufficient documentation

## 2010-11-12 HISTORY — DX: Rheumatoid arthritis, unspecified: M06.9

## 2010-11-12 MED ORDER — POLYETHYLENE GLYCOL 3350 17 GM/SCOOP PO POWD: 17.0000 g | Freq: Every day | ORAL | Status: DC

## 2010-11-12 MED ORDER — POLYETHYLENE GLYCOL 3350 17 GM/SCOOP PO POWD: 17.0000 g | Freq: Every day | ORAL | Status: AC

## 2010-11-12 NOTE — Patient Instructions (Signed)
Take MiraLAX daily x2 for 10-12 days and then when necessary. Return in December for Pap smear

## 2010-11-12 NOTE — Progress Notes (Signed)
Subjective:     Patient ID: Melford Aase, female   DOB: 1980/10/18, 30 y.o.   MRN: [id number]  HPI patient is a 30 year old Spanish-speaking Hispanic female who delivered by cesarean section with postpartum tubal ligation on 09/09/2010. She's had no postpartum problems related to her pregnancy she does take plaque were no and prednisone for rheumatoid arthritis she's currently on 10 mg a day eventually going to reduce the 5 mg a day and being followed by rheumatologist. Leanora Ivanoff to practically no pills per day. Her only postpartum problem is chronic constipation Colace has not helped with a try her on MiraLAX given her a prescription for that. Her abdomen wound is well healed and she does not need a Pap smear until December of 12.   Review of Systems     Objective:   Physical Exam     Assessment:         Plan:    try to control constipation with MiraLAX. Return in December for Pap smear.

## 2011-02-19 IMAGING — CR DG CHEST 2V
2 series · 2 of 2 positions shown · non-contrast
Comparison: None

CLINICAL DATA: Cough and cold symptoms.

CHEST - 2 VIEW

[w chest pa]
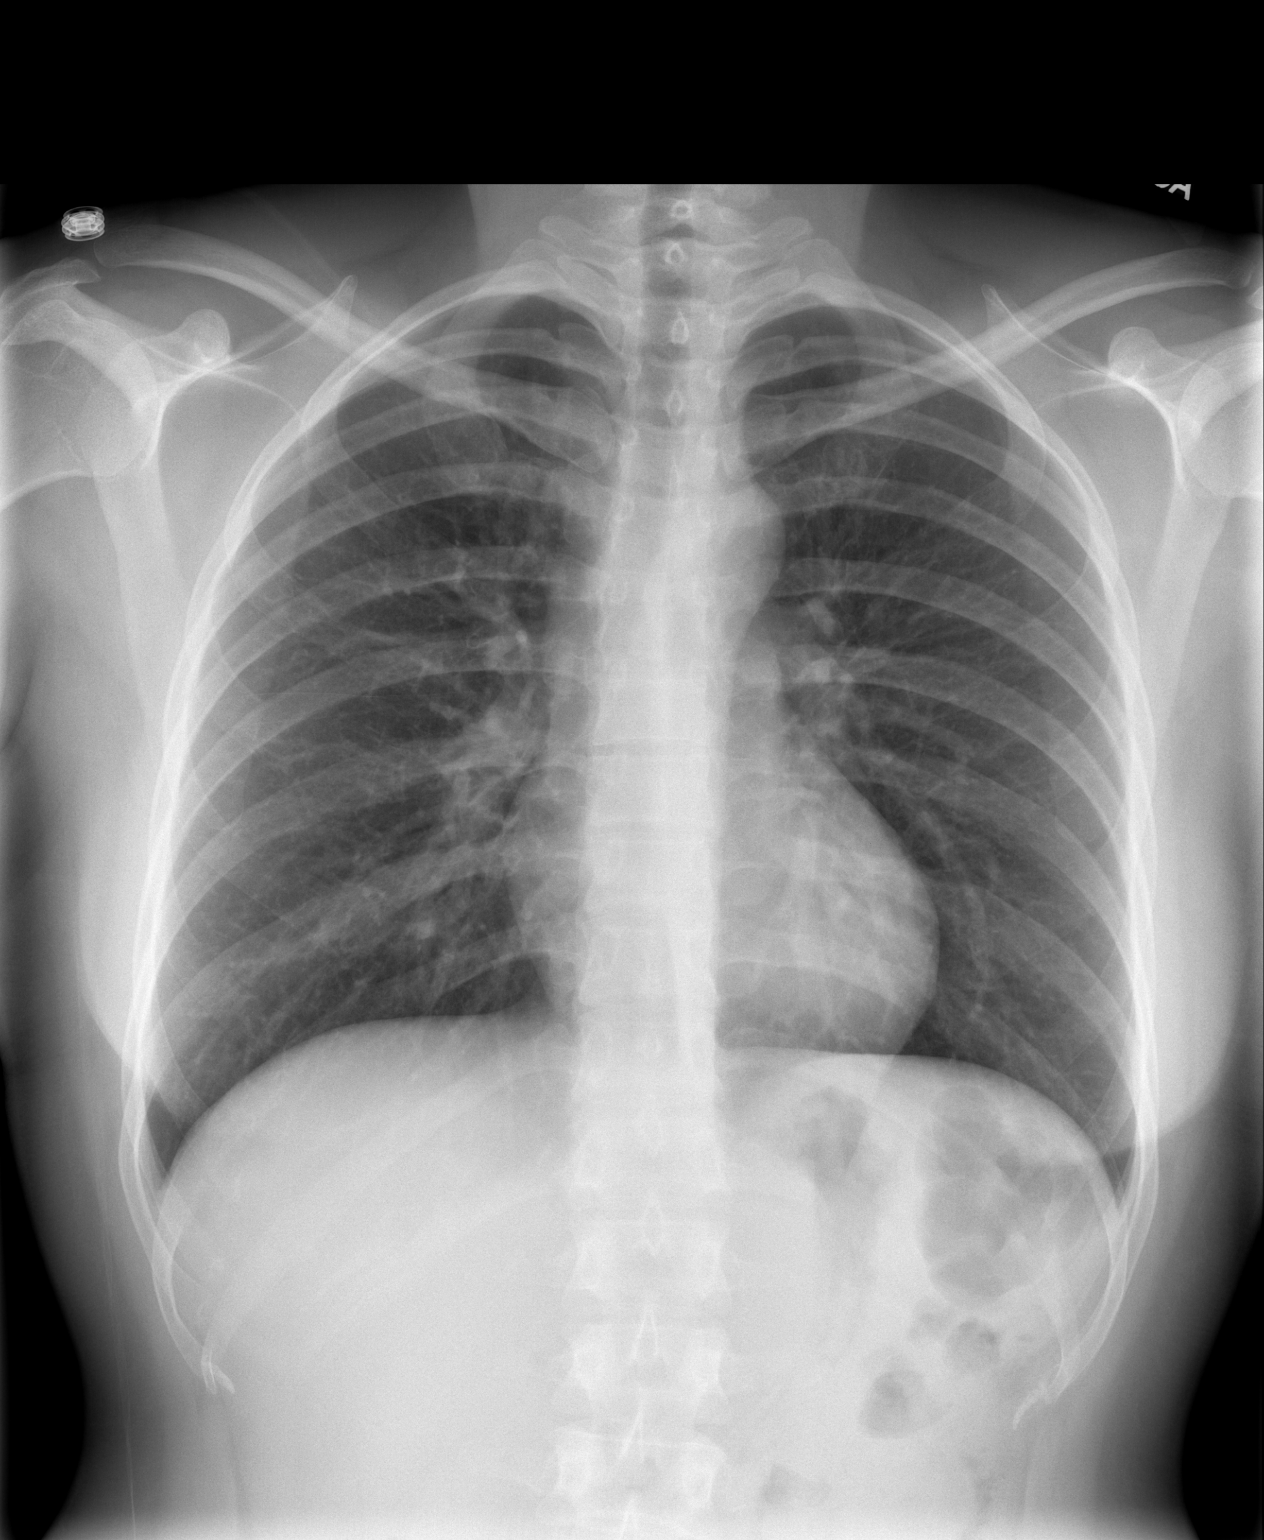

[w chest lat]
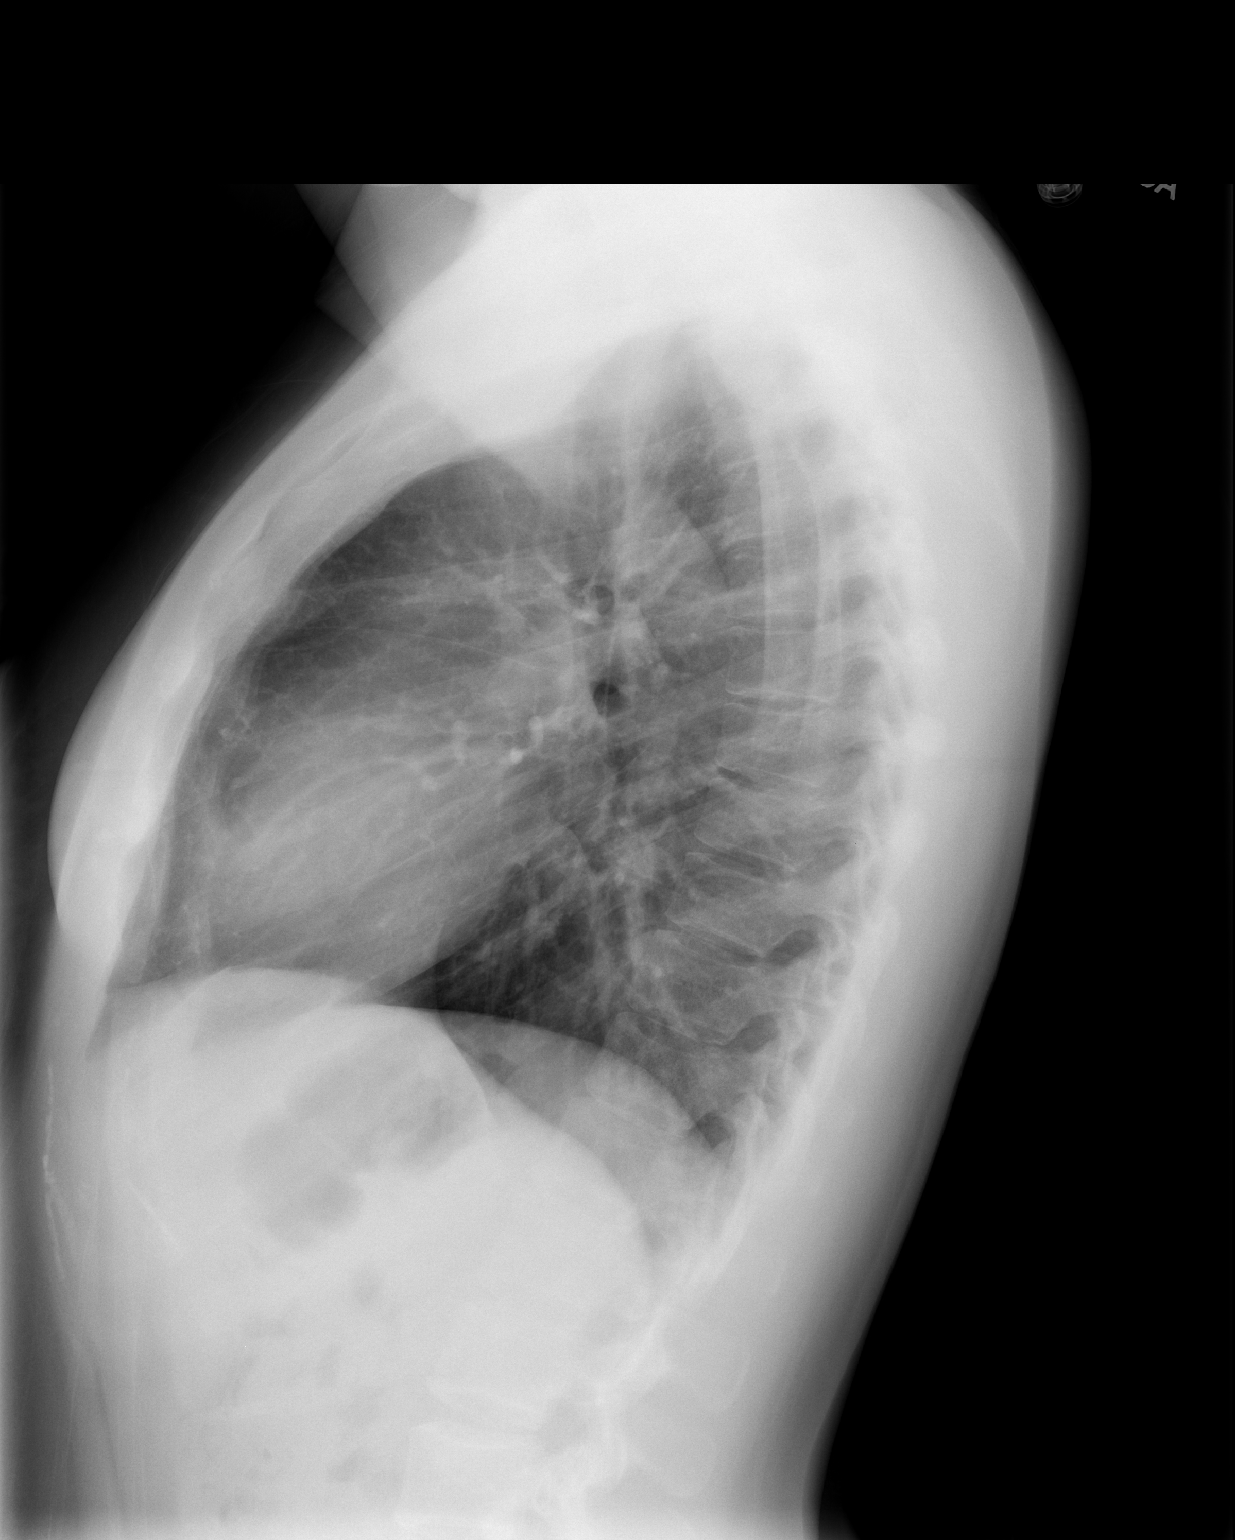

[2 of 2 positions shown; findings below may reference images not displayed]

FINDINGS: The cardiac silhouette, mediastinal and hilar contours
are within normal limits.  There is mild peribronchial thickening
and increased interstitial markings which could reflect bronchitis.
No infiltrates, edema or effusions.  The bony thorax is intact.
IMPRESSION: Mild bronchitic changes.  No infiltrates, edema or effusions.

## 2011-05-01 ENCOUNTER — Emergency Department (HOSPITAL_COMMUNITY)
Admission: EM | Admit: 2011-05-01 | Discharge: 2011-05-01 | Disposition: A | Discharge status: Home / Self Care | Payer: Self-pay | Source: Home / Self Care | Attending: Emergency Medicine | Admitting: Emergency Medicine

## 2011-05-01 ENCOUNTER — Encounter (HOSPITAL_COMMUNITY): Payer: Self-pay | Admitting: Emergency Medicine

## 2011-05-01 DIAGNOSIS — M069 Rheumatoid arthritis, unspecified: Secondary | ICD-10-CM

## 2011-05-01 MED ORDER — PREDNISONE 5 MG PO TABS: 20.0000 mg | ORAL_TABLET | Freq: Every day | ORAL | Status: DC

## 2011-05-01 MED ORDER — HYDROCODONE-ACETAMINOPHEN 5-500 MG PO TABS: 1.0000 | ORAL_TABLET | Freq: Four times a day (QID) | ORAL | Status: DC | PRN

## 2011-05-01 NOTE — ED Notes (Signed)
Reports joint pain, wrists, feet, neck pain. History of the same

## 2011-05-01 NOTE — ED Provider Notes (Signed)
History     CSN: [id number]  Arrival date & time 05/01/11  1345   First MD Initiated Contact with Patient 05/01/11 1501      Chief Complaint  Patient presents with  . Joint Pain    (Consider location/radiation/quality/duration/timing/severity/associated sxs/prior treatment) HPI Comments: Patient presents urgent care today complaining of generalize joint pains or predominantly both feet both wrists and fingers (points to metacarpophalangeal joints). Patient describes that she ran out of prednisone last Monday she was instructed by her rheumatologist to decrease dose to 2.5 which she attempted and noticed that mild discomfort started to sit in which she felt was her arthritis becoming active again. As she can take more prednisone since Monday she started noticing pain became more severe started affecting more joints been feeling fatigue and tired and was using Motrin left over from previous prescription to control pain. Describes that for the last 2 days pain is just becoming too severe to tolerate any further and she feels that she needs to take some prednisone and she will contact her rheumatologist tomorrow. She reports that yesterday she felt that she might have had a fever but denies any other symptoms respiratory gastrointestinal or dysuria.  Patient is a 31 y.o. female presenting with lower extremity pain.  Foot Pain The current episode started more than 2 days ago. The problem occurs constantly. The problem has been gradually worsening. Pertinent negatives include no abdominal pain, no headaches and no shortness of breath. The symptoms are relieved by walking. Treatments tried: motrin.    Past Medical History  Diagnosis Date  . Arthritis   . Rheumatoid arthritis of the hand 11/12/2010    Past Surgical History  Procedure Date  . Cesarean section   . Tubal ligation     No family history on file.  History  Substance Use Topics  . Smoking status: Never Smoker   . Smokeless  tobacco: Not on file  . Alcohol Use: No    OB History    Grav Para Term Preterm Abortions TAB SAB Ect Mult Living   4 3 1 2      3       Review of Systems  Constitutional: Positive for chills, appetite change and fatigue.  Respiratory: Negative for shortness of breath.   Gastrointestinal: Negative for abdominal pain.  Genitourinary: Negative for flank pain.  Musculoskeletal: Positive for joint swelling, arthralgias and gait problem.  Neurological: Negative for dizziness and headaches.    Allergies  Review of patient's allergies indicates no known allergies.  Home Medications   Current Outpatient Rx  Name Route Sig Dispense Refill  . DOCUSATE SODIUM 100 MG PO CAPS Oral Take 100 mg by mouth 2 (two) times daily as needed.     Marland Kitchen HYDROCODONE-ACETAMINOPHEN 5-500 MG PO TABS Oral Take 1 tablet by mouth every 4 (four) hours as needed. For pain      . HYDROXYCHLOROQUINE SULFATE 200 MG PO TABS Oral Take 200 mg by mouth 3 (three) times daily.      . IBUPROFEN 200 MG PO TABS Oral Take 600 mg by mouth every 12 (twelve) hours as needed. For pain      . PREDNISONE 10 MG PO TABS Oral Take 15 mg by mouth daily. As needed for arthritis    . PRENATAL PO Oral Take 1 tablet by mouth daily.        BP 117/77  Pulse 72  Temp(Src) 97.9 F (36.6 C) (Oral)  Resp 20  SpO2 100%  Physical Exam  Constitutional: She appears well-developed. No distress.  HENT:  Head: Normocephalic.  Eyes: Conjunctivae are normal. Right eye exhibits no discharge. Left eye exhibits discharge.  Neck: Normal range of motion.  Neurological: She is alert.  Skin: She is not diaphoretic. No erythema.       ED Course  Procedures (including critical care time)  Labs Reviewed - No data to display No results found.   No diagnosis found.    MDM  Rheumatoid arthritis exacerbation patient ran out of prednisone about 6 days ago. Rheumatologist with sickle cell been trying to titrate prednisone down to her last  encounter as per patient report in November was told to take 2.5 mg daily. She attempted to call rheumatologist last week unable to obtain a refill of her prednisone and patient reports did not receive a call back. Patient describes polyarthralgias mainly to both of her feet wrist metacarpophalangeal joints and left shoulder.        Jimmie Molly, MD 05/01/11 [phone]

## 2011-05-01 NOTE — Discharge Instructions (Signed)
Empieze a tomar 5 mg de prednisona, y llame a su rheumatologo manana como discutimos Recruitment consultant o es posible que quieran verla-

## 2011-07-28 IMAGING — CR DG SHOULDER 2+V*R*
3 series · 3 of 3 positions shown · non-contrast
Comparison: None.

CLINICAL DATA: Right neck and shoulder pain.

RIGHT SHOULDER - 2+ VIEW

[w shoulder ap internal righ]
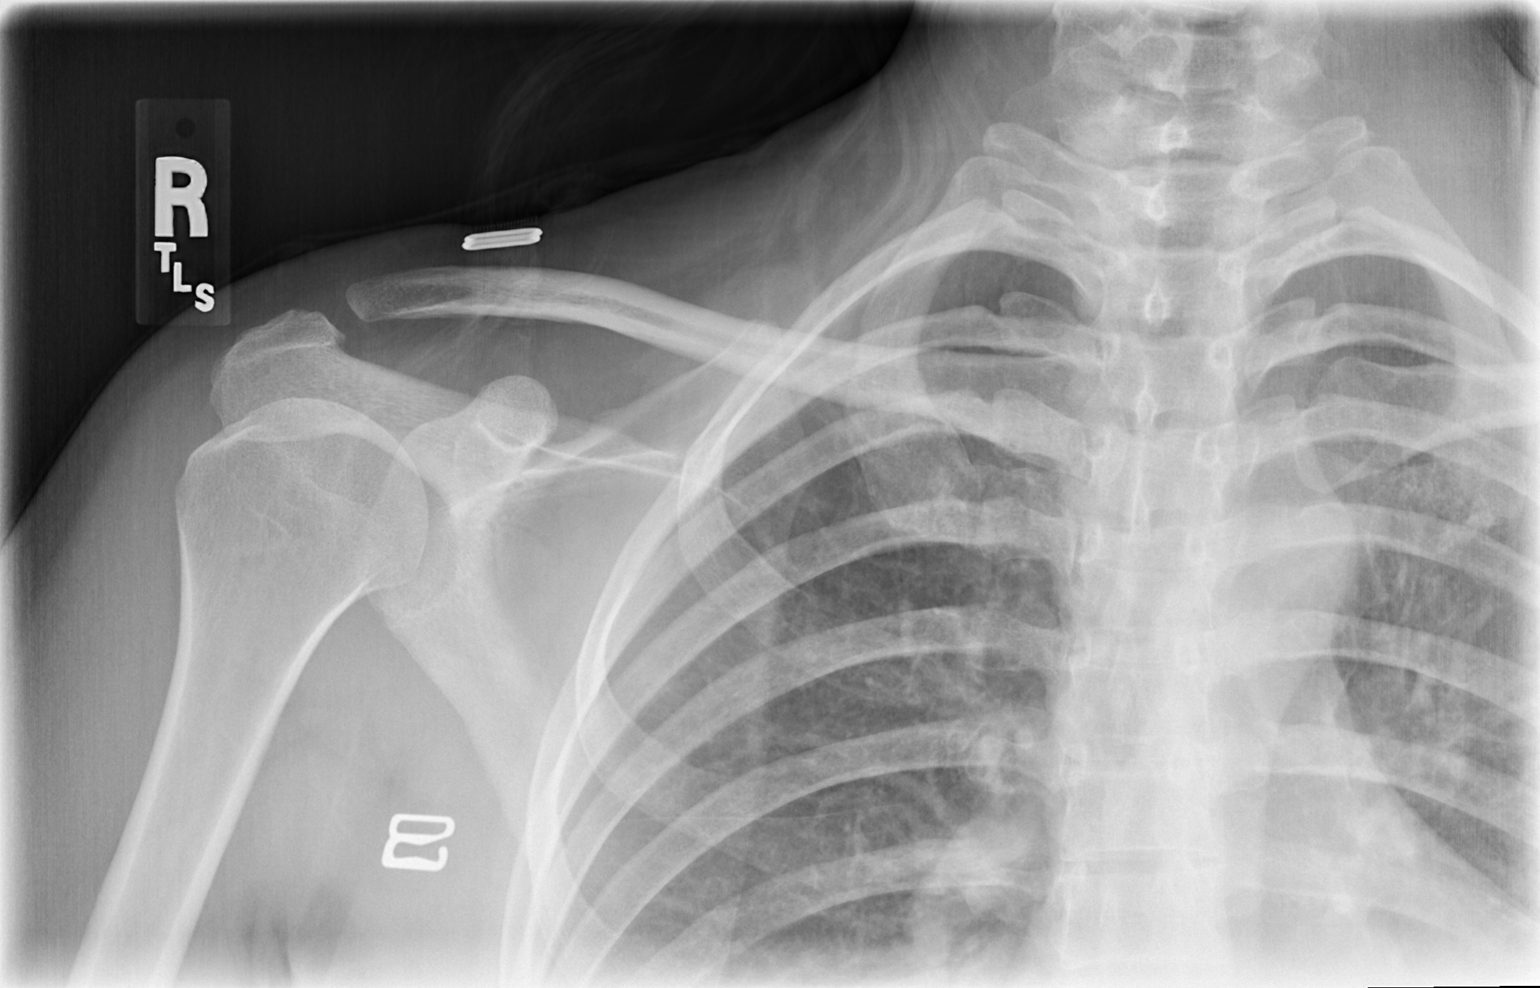

[w shoulder ap external righ]
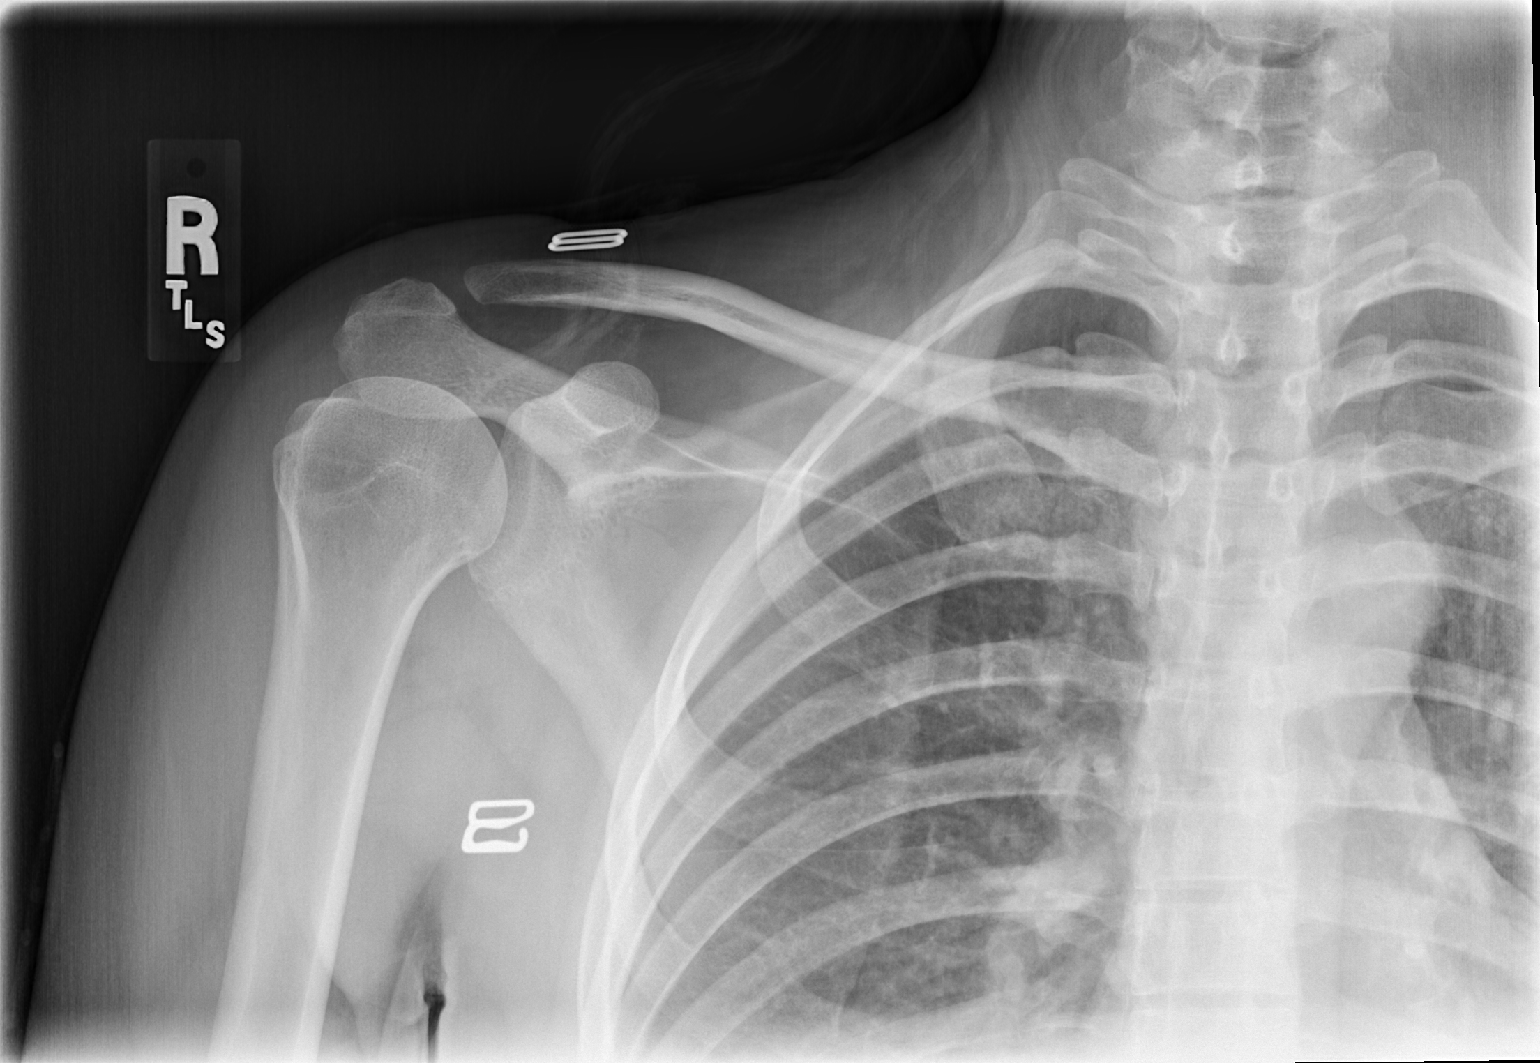

[w shoulder y view right]
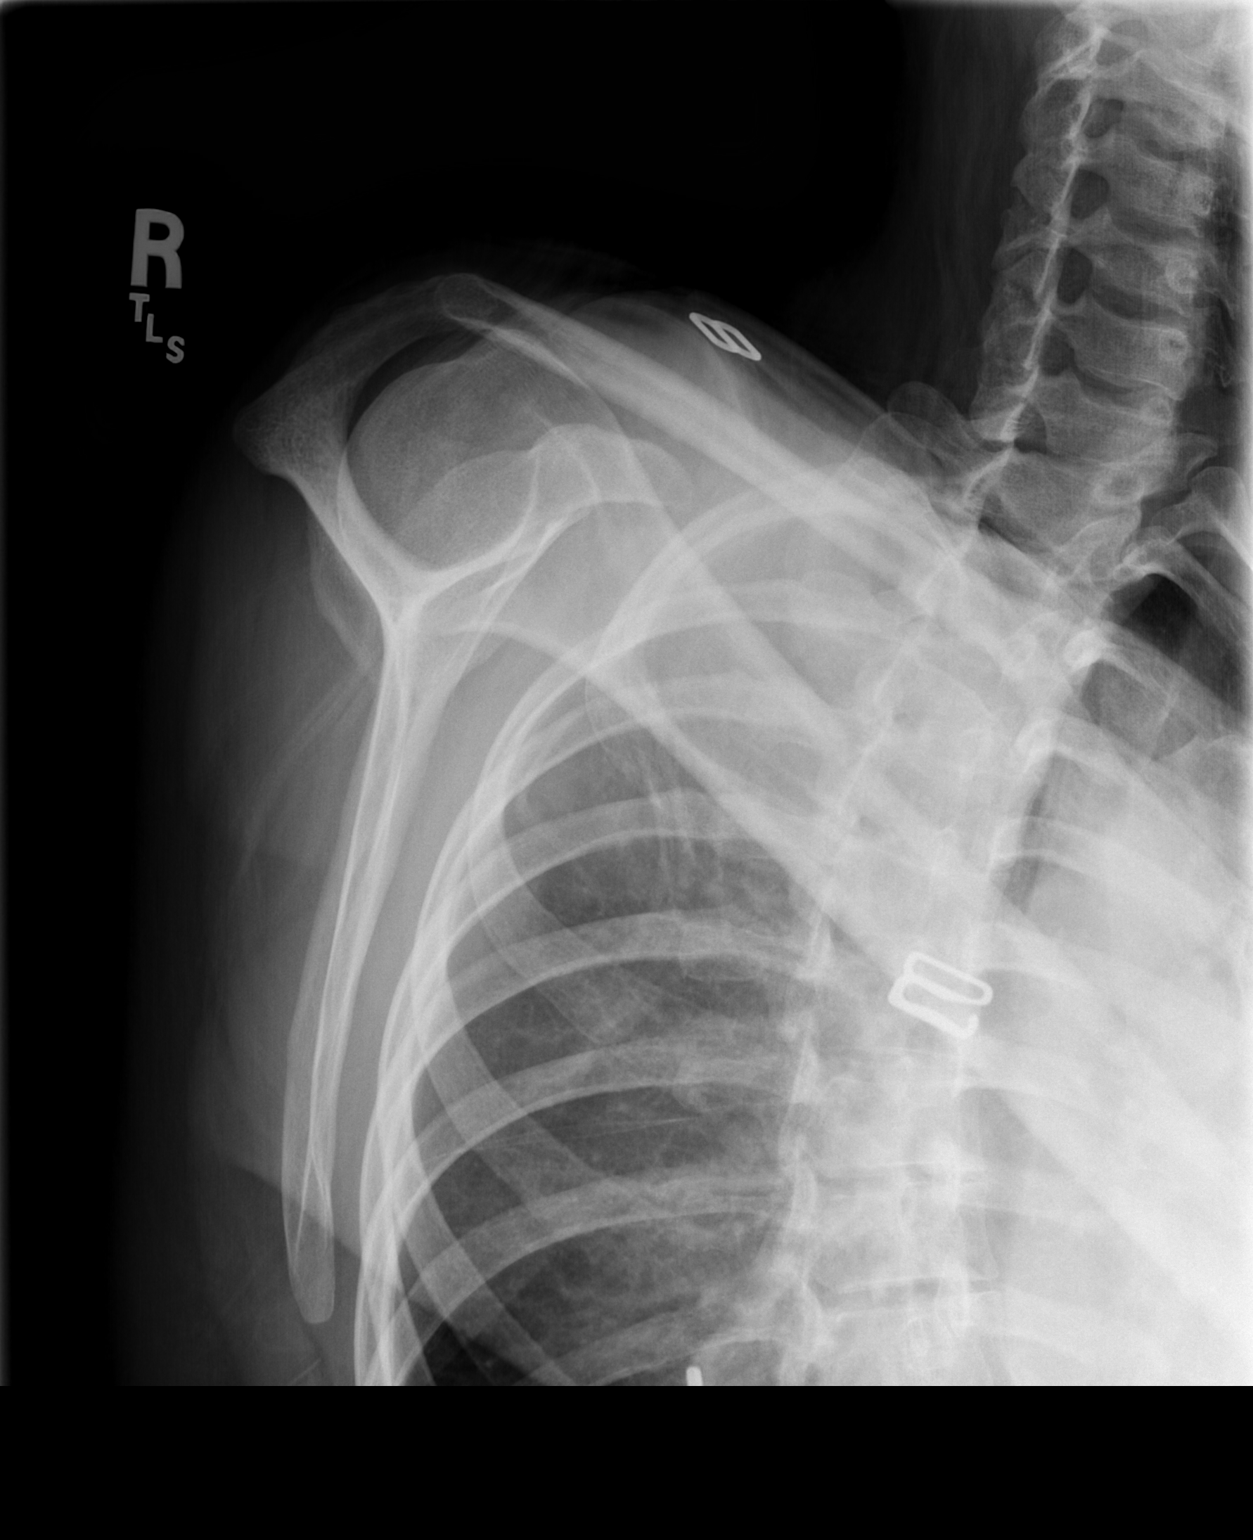

[3 of 3 positions shown; findings below may reference images not displayed]

FINDINGS: No fracture or dislocation.  No significant degenerative
changes in the right acromioclavicular joint.  Visualized right
chest is unremarkable.
IMPRESSION: Negative exam.

## 2011-12-02 ENCOUNTER — Emergency Department (HOSPITAL_COMMUNITY)
Admission: EM | Admit: 2011-12-02 | Discharge: 2011-12-02 | Disposition: A | Discharge status: Home / Self Care | Payer: Self-pay | Source: Home / Self Care | Attending: Emergency Medicine | Admitting: Emergency Medicine

## 2011-12-02 ENCOUNTER — Encounter (HOSPITAL_COMMUNITY): Payer: Self-pay | Admitting: *Deleted

## 2011-12-02 ENCOUNTER — Emergency Department (INDEPENDENT_AMBULATORY_CARE_PROVIDER_SITE_OTHER): Payer: Self-pay

## 2011-12-02 DIAGNOSIS — R81 Glycosuria: Secondary | ICD-10-CM

## 2011-12-02 DIAGNOSIS — M549 Dorsalgia, unspecified: Secondary | ICD-10-CM

## 2011-12-02 DIAGNOSIS — R809 Proteinuria, unspecified: Secondary | ICD-10-CM

## 2011-12-02 HISTORY — DX: Rheumatoid arthritis, unspecified: M06.9

## 2011-12-02 HISTORY — DX: Calculus of kidney: N20.0

## 2011-12-02 LAB — URINALYSIS, ROUTINE W REFLEX MICROSCOPIC
Bilirubin Urine: NEGATIVE
Glucose, UA: NEGATIVE mg/dL
Ketones, ur: 15 mg/dL — AB
Nitrite: NEGATIVE
pH: 5.5 (ref 5.0–8.0)

## 2011-12-02 LAB — POCT URINALYSIS DIP (DEVICE)
Bilirubin Urine: NEGATIVE
Glucose, UA: 100 mg/dL — AB
Hgb urine dipstick: NEGATIVE
Ketones, ur: 15 mg/dL — AB
Nitrite: NEGATIVE
Specific Gravity, Urine: >=1.03 (ref 1.005–1.030)

## 2011-12-02 LAB — POCT I-STAT, CHEM 8
BUN: 16 mg/dL (ref 6–23)
Chloride: 106 mEq/L (ref 96–112)
Creatinine, Ser: 1 mg/dL (ref 0.50–1.10)
Hemoglobin: 13.3 g/dL (ref 12.0–15.0)
Potassium: 3.5 mEq/L (ref 3.5–5.1)
Sodium: 143 mEq/L (ref 135–145)

## 2011-12-02 LAB — URINE MICROSCOPIC-ADD ON

## 2011-12-02 MED ORDER — HYDROCODONE-ACETAMINOPHEN 5-325 MG PO TABS: 2.0000 | ORAL_TABLET | ORAL | Status: AC | PRN

## 2011-12-02 MED ORDER — TAMSULOSIN HCL 0.4 MG PO CAPS: 0.4000 mg | ORAL_CAPSULE | Freq: Every day | ORAL | Status: DC

## 2011-12-02 MED ORDER — KETOROLAC TROMETHAMINE 10 MG PO TABS: 10.0000 mg | ORAL_TABLET | Freq: Four times a day (QID) | ORAL | Status: DC | PRN

## 2011-12-02 NOTE — ED Notes (Signed)
Pt  Reports         Back  Pain  r  Side  For  About  1  Month   She reports  Pain            Is  Worse on  Movements  And  posistion   She  Reports  Some  Burning on  Urination as  Well          She  Ambulates  With a  Steady  gfluid  Gait in an  Upright  Fashion         He  Has  Arthritis   As well and  Takes  meds  For same

## 2011-12-02 NOTE — ED Provider Notes (Signed)
History     CSN: [id number]  Arrival date & time 12/02/11  1644   First MD Initiated Contact with Patient 12/02/11 1710      Chief Complaint  Patient presents with  . Back Pain    (Consider location/radiation/quality/duration/timing/severity/associated sxs/prior treatment) HPI Comments: Patient history rheumatoid arthritis, on chronic prednisone reports one week of daily nonradiating right-sided intermittent back pain described as "contractions". It is worse with movement, torso rotation. It has not moved in location since it started.  no dysuria, urinary urgency, frequency, cloudy or oderous urine, hematuria. Some nausea when the pain gets Severe.  No vomiting. No fevers, abdominal pain. No coughing, wheezing, chest pain, shortness of breath. Is having normal bowel movements, last one today. States that this also makes back pain worse while straining.  States this feels identical to previous episodes kidney stones. No h/o obstructing kidney stones. No recent or remote history of trauma to her back. No change in physical activity. No unintentional weight loss, IVDU, h/o CA, fevers/night sweats, pain worse at night, h/o HIV, neurologic deficits, urinary retention.    ROS as noted in HPI. All other ROS negative.   Patient is a 31 y.o. female presenting with back pain. The history is provided by the patient and medical records. The history is limited by a language barrier. A language interpreter was used.  Back Pain  This is a recurrent problem. The current episode started more than 2 days ago. The problem occurs constantly. The problem has not changed since onset.The pain is associated with no known injury. The pain is present in the lumbar spine. The quality of the pain is described as cramping. The pain does not radiate. The symptoms are aggravated by bending, twisting and certain positions. Pertinent negatives include no chest pain, no fever, no abdominal pain, no abdominal swelling, no bowel  incontinence, no bladder incontinence, no pelvic pain, no leg pain and no paresthesias. She has tried NSAIDs for the symptoms. The treatment provided no relief.    Past Medical History  Diagnosis Date  . Arthritis   . Rheumatoid arthritis of the hand 11/12/2010  . Rheumatoid arthritis   . Kidney stones     Past Surgical History  Procedure Date  . Cesarean section   . Tubal ligation     History reviewed. No pertinent family history.  History  Substance Use Topics  . Smoking status: Never Smoker   . Smokeless tobacco: Not on file  . Alcohol Use: No    OB History    Grav Para Term Preterm Abortions TAB SAB Ect Mult Living   4 3 1 2      3       Review of Systems  Constitutional: Negative for fever.  Cardiovascular: Negative for chest pain.  Gastrointestinal: Negative for abdominal pain and bowel incontinence.  Genitourinary: Negative for bladder incontinence and pelvic pain.  Musculoskeletal: Positive for back pain.  Neurological: Negative for paresthesias.    Allergies  Review of patient's allergies indicates no known allergies.  Home Medications   Current Outpatient Rx  Name Route Sig Dispense Refill  . DOCUSATE SODIUM 100 MG PO CAPS Oral Take 100 mg by mouth 2 (two) times daily as needed.     Marland Kitchen FOLIC ACID 1 MG PO TABS Oral Take 1 mg by mouth daily.    Marland Kitchen HYDROCODONE-ACETAMINOPHEN 5-325 MG PO TABS Oral Take 2 tablets by mouth every 4 (four) hours as needed for pain. 20 tablet 0  . HYDROXYCHLOROQUINE SULFATE 200  MG PO TABS Oral Take 200 mg by mouth 2 (two) times daily.     Marland Kitchen KETOROLAC TROMETHAMINE 10 MG PO TABS Oral Take 1 tablet (10 mg total) by mouth 4 (four) times daily as needed for pain. 20 tablet 0  . METHOTREXATE 2.5 MG PO TABS Oral Take 2.5 mg by mouth once a week. Caution:Chemotherapy. Protect from light. Takes 4 tablets each Monday.    Marland Kitchen PREDNISONE 10 MG PO TABS Oral Take 15 mg by mouth daily. As needed for arthritis Ran out Monday 04/26/2011    .  PREDNISONE 5 MG PO TABS Oral Take 4 tablets (20 mg total) by mouth daily. 20 tablet 0  . PRENATAL PO Oral Take 1 tablet by mouth daily.      Marland Kitchen TAMSULOSIN HCL 0.4 MG PO CAPS Oral Take 1 capsule (0.4 mg total) by mouth at bedtime. 14 capsule 0    BP 104/69  Pulse 86  Temp 98.4 F (36.9 C) (Oral)  Resp 18  SpO2 98%  LMP 11/12/2011  Physical Exam  Nursing note and vitals reviewed. Constitutional: She is oriented to person, place, and time. She appears well-developed and well-nourished. No distress.  HENT:  Head: Normocephalic and atraumatic.  Eyes: Conjunctivae normal and EOM are normal.  Neck: Normal range of motion.  Cardiovascular: Normal rate and regular rhythm.   Pulmonary/Chest: Effort normal and breath sounds normal.  Abdominal: Soft. Normal appearance and bowel sounds are normal. She exhibits no distension and no mass. There is no tenderness. There is CVA tenderness. There is no rebound, no guarding, no tenderness at McBurney's point and negative Murphy's sign.       R CVAT  Musculoskeletal: Normal range of motion.  Neurological: She is alert and oriented to person, place, and time. Coordination normal.  Skin: Skin is warm and dry.  Psychiatric: She has a normal mood and affect. Her behavior is normal. Judgment and thought content normal.    ED Course  Procedures (including critical care time)   Results for orders placed during the hospital encounter of 12/02/11  POCT URINALYSIS DIP (DEVICE)      Component Value Range   Glucose, UA 100 (*) NEGATIVE mg/dL   Bilirubin Urine NEGATIVE  NEGATIVE   Ketones, ur 15 (*) NEGATIVE mg/dL   Specific Gravity, Urine >=1.030  1.005 - 1.030   Hgb urine dipstick NEGATIVE  NEGATIVE   pH 5.5  5.0 - 8.0   Protein, ur >=300 (*) NEGATIVE mg/dL   Urobilinogen, UA 0.2  0.0 - 1.0 mg/dL   Nitrite NEGATIVE  NEGATIVE   Leukocytes, UA NEGATIVE  NEGATIVE  POCT PREGNANCY, URINE      Component Value Range   Preg Test, Ur NEGATIVE  NEGATIVE  POCT  I-STAT, CHEM 8      Component Value Range   Sodium 143  135 - 145 mEq/L   Potassium 3.5  3.5 - 5.1 mEq/L   Chloride 106  96 - 112 mEq/L   BUN 16  6 - 23 mg/dL   Creatinine, Ser 1.61  0.50 - 1.10 mg/dL   Glucose, Bld 81  70 - 99 mg/dL   Calcium, Ion 0.96  1.12 - 1.23 mmol/L   TCO2 24  0 - 100 mmol/L   Hemoglobin 13.3  12.0 - 15.0 g/dL   HCT 04.5  40.9 - 81.1 %  URINALYSIS, ROUTINE W REFLEX MICROSCOPIC      Component Value Range   Color, Urine AMBER (*) YELLOW   APPearance TURBID (*)  CLEAR   Specific Gravity, Urine >1.030 (*) 1.005 - 1.030   pH 5.5  5.0 - 8.0   Glucose, UA NEGATIVE  NEGATIVE mg/dL   Hgb urine dipstick NEGATIVE  NEGATIVE   Bilirubin Urine NEGATIVE  NEGATIVE   Ketones, ur 15 (*) NEGATIVE mg/dL   Protein, ur 098 (*) NEGATIVE mg/dL   Urobilinogen, UA 1.0  0.0 - 1.0 mg/dL   Nitrite NEGATIVE  NEGATIVE   Leukocytes, UA NEGATIVE  NEGATIVE  URINE MICROSCOPIC-ADD ON      Component Value Range   WBC, UA 0-2  <3 WBC/hpf   RBC / HPF 0-2  <3 RBC/hpf   Crystals CA OXALATE CRYSTALS (*) NEGATIVE   Urine-Other AMORPHOUS URATES/PHOSPHATES     Dg Thoracic Spine 2 View  12/02/2011  *RADIOLOGY REPORT*  Clinical Data: Right lower back and mid back pain.  THORACIC SPINE - 2 VIEW  Comparison: Two-view chest 03/21/2009.  Findings: 12 rib-bearing thoracic type vertebral bodies are present.  The vertebral body heights and alignment are maintained. No acute bone or soft tissue abnormality is evident.  IMPRESSION: Negative thoracic spine.   Original Report Authenticated By: Jamesetta Orleans. MATTERN, M.D.    Dg Lumbar Spine Complete  12/02/2011  *RADIOLOGY REPORT*  Clinical Data: Low back pain.  No injury.  LUMBAR SPINE - COMPLETE 4+ VIEW  Comparison:  None.  Findings:  There is no evidence of lumbar spine fracture. Alignment is normal.  Intervertebral disc spaces are maintained.  IMPRESSION: Negative.   Original Report Authenticated By: Elsie Stain, M.D.      1. Back pain   2. Proteinuria    3. Glucosuria     MDM  Previous records reviewed. Additional medical history obtained. Had a normal abdominal ultrasound in 08/2009.  Has a history of polyarticular rheumatoid arthritis, taking Plaquenil and prednisone. She does have a rheumatologist. Obtaining x-rays as patient is high-risk for nontraumatic fracture given history of repeated prednisone use due to RA.  X-rays reviewed by myself. No fracture. Full report per radiology.  Urine noted, will send this off for micro and culture. Treating as kidney stone. Will have her followup with Dr. Brunilda Payor, urology on call  Luiz Blare, MD 12/03/11 [phone]

## 2011-12-03 ENCOUNTER — Encounter (HOSPITAL_COMMUNITY): Payer: Self-pay | Admitting: Emergency Medicine

## 2011-12-03 ENCOUNTER — Emergency Department (HOSPITAL_COMMUNITY)
Admission: EM | Admit: 2011-12-03 | Discharge: 2011-12-04 | Disposition: A | Discharge status: Home / Self Care | Payer: Self-pay | Attending: Emergency Medicine | Admitting: Emergency Medicine

## 2011-12-03 DIAGNOSIS — M069 Rheumatoid arthritis, unspecified: Secondary | ICD-10-CM | POA: Insufficient documentation

## 2011-12-03 DIAGNOSIS — T448X5A Adverse effect of centrally-acting and adrenergic-neuron-blocking agents, initial encounter: Secondary | ICD-10-CM | POA: Insufficient documentation

## 2011-12-03 DIAGNOSIS — T7840XA Allergy, unspecified, initial encounter: Secondary | ICD-10-CM

## 2011-12-03 DIAGNOSIS — R070 Pain in throat: Secondary | ICD-10-CM | POA: Insufficient documentation

## 2011-12-03 DIAGNOSIS — Z888 Allergy status to other drugs, medicaments and biological substances status: Secondary | ICD-10-CM | POA: Insufficient documentation

## 2011-12-03 LAB — URINE CULTURE

## 2011-12-03 NOTE — ED Notes (Signed)
Pt reports taking flomax, toradol, vicodin about 1.5 hours ago, now having itching to face,nose and feels like throat is swelling; no noted swelling on assessment; pt talking in complete sentences without difficulty

## 2011-12-03 NOTE — ED Provider Notes (Signed)
History     CSN: [id number]  Arrival date & time 12/03/11  2152   First MD Initiated Contact with Patient 12/03/11 2308      Chief Complaint  Patient presents with  . Medication Reaction    (Consider location/radiation/quality/duration/timing/severity/associated sxs/prior treatment) HPI Comments: Patient states she was seen at urgent care today for "a kidney stone."  Started her on Vicodin for a lack, and Flomax, she, states, approximately 30 minutes after taking the Flomax.  She noticed some throat tightening, and shortness of breath.  This lasted approximately an hour and resolved.  Patient is on chronic steroid therapy for her rheumatoid arthritis  The history is provided by the patient.    Past Medical History  Diagnosis Date  . Arthritis   . Rheumatoid arthritis of the hand 11/12/2010  . Rheumatoid arthritis   . Kidney stones     Past Surgical History  Procedure Date  . Cesarean section   . Tubal ligation     History reviewed. No pertinent family history.  History  Substance Use Topics  . Smoking status: Never Smoker   . Smokeless tobacco: Not on file  . Alcohol Use: No    OB History    Grav Para Term Preterm Abortions TAB SAB Ect Mult Living   4 3 1 2      3       Review of Systems  Constitutional: Negative for fever and chills.  Respiratory: Negative for shortness of breath.   Gastrointestinal: Negative for nausea.  Genitourinary: Negative for dysuria.  Neurological: Negative for dizziness, weakness and numbness.    Allergies  Tamsulosin  Home Medications   Current Outpatient Rx  Name Route Sig Dispense Refill  . FOLIC ACID PO Oral Take 1 tablet by mouth daily.    Marland Kitchen HYDROCODONE-ACETAMINOPHEN 5-325 MG PO TABS Oral Take 2 tablets by mouth every 4 (four) hours as needed for pain. 20 tablet 0  . HYDROXYCHLOROQUINE SULFATE 200 MG PO TABS Oral Take 200 mg by mouth 2 (two) times daily.     Marland Kitchen KETOROLAC TROMETHAMINE 10 MG PO TABS Oral Take 1 tablet (10  mg total) by mouth 4 (four) times daily as needed for pain. 20 tablet 0  . METHOTREXATE 2.5 MG PO TABS Oral Take 15 mg by mouth once a week. Caution:Chemotherapy. Protect from light. Takes 6 tablets each Monday.    Marland Kitchen PREDNISONE 5 MG PO TABS Oral Take 5 mg by mouth daily.    Marland Kitchen PRENATAL PO Oral Take 1 tablet by mouth daily.      Marland Kitchen TAMSULOSIN HCL 0.4 MG PO CAPS Oral Take 1 capsule (0.4 mg total) by mouth at bedtime. 14 capsule 0    BP 131/87  Pulse 91  Temp 97.9 F (36.6 C) (Oral)  Resp 18  SpO2 100%  LMP 11/12/2011  Physical Exam  Constitutional: She appears well-developed and well-nourished.  HENT:  Head: Normocephalic.  Eyes: Pupils are equal, round, and reactive to light.  Neck: Normal range of motion.  Cardiovascular: Normal rate.   Pulmonary/Chest: Effort normal. No respiratory distress. She has no wheezes.  Musculoskeletal: Normal range of motion.  Skin: Skin is warm. No rash noted. No erythema.    ED Course  Procedures (including critical care time)  Labs Reviewed - No data to display Dg Thoracic Spine 2 View  12/02/2011  *RADIOLOGY REPORT*  Clinical Data: Right lower back and mid back pain.  THORACIC SPINE - 2 VIEW  Comparison: Two-view chest 03/21/2009.  Findings:  12 rib-bearing thoracic type vertebral bodies are present.  The vertebral body heights and alignment are maintained. No acute bone or soft tissue abnormality is evident.  IMPRESSION: Negative thoracic spine.   Original Report Authenticated By: Jamesetta Orleans. MATTERN, M.D.    Dg Lumbar Spine Complete  12/02/2011  *RADIOLOGY REPORT*  Clinical Data: Low back pain.  No injury.  LUMBAR SPINE - COMPLETE 4+ VIEW  Comparison:  None.  Findings:  There is no evidence of lumbar spine fracture. Alignment is normal.  Intervertebral disc spaces are maintained.  IMPRESSION: Negative.   Original Report Authenticated By: Elsie Stain, M.D.      1. Allergic reaction       MDM  Fortunately this patient is currently  taking steroids on a chronic basis.  I think is modulated.  Her allergic reaction, which is totally, resolved.  At this time.  I've instructed not to.  Take Flomax in the future.  I have listed as an allergy.  I have sent her remaining prescription medications to the pharmacy for disposal         Arman Filter, NP 12/03/11 2353

## 2011-12-04 NOTE — ED Notes (Signed)
Pt alert and oriented, with steady gait at time of discharge. Pt given discharge papers and papers explained. All questions answered and pt walked to discharge.  

## 2013-10-05 ENCOUNTER — Encounter (HOSPITAL_COMMUNITY): Payer: Self-pay | Admitting: Emergency Medicine

## 2013-10-05 ENCOUNTER — Emergency Department (HOSPITAL_COMMUNITY)
Admission: EM | Admit: 2013-10-05 | Discharge: 2013-10-05 | Disposition: A | Discharge status: Home / Self Care | Payer: No Typology Code available for payment source | Source: Home / Self Care

## 2013-10-05 DIAGNOSIS — Z87898 Personal history of other specified conditions: Secondary | ICD-10-CM

## 2013-10-05 DIAGNOSIS — R002 Palpitations: Secondary | ICD-10-CM

## 2013-10-05 DIAGNOSIS — R42 Dizziness and giddiness: Secondary | ICD-10-CM

## 2013-10-05 DIAGNOSIS — F411 Generalized anxiety disorder: Secondary | ICD-10-CM

## 2013-10-05 DIAGNOSIS — F419 Anxiety disorder, unspecified: Secondary | ICD-10-CM

## 2013-10-05 LAB — POCT URINALYSIS DIP (DEVICE)
Bilirubin Urine: NEGATIVE
GLUCOSE, UA: NEGATIVE mg/dL
Ketones, ur: NEGATIVE mg/dL
Leukocytes, UA: NEGATIVE
NITRITE: NEGATIVE
PH: 6 (ref 5.0–8.0)
PROTEIN: NEGATIVE mg/dL
SPECIFIC GRAVITY, URINE: 1.025 (ref 1.005–1.030)
UROBILINOGEN UA: 0.2 mg/dL (ref 0.0–1.0)

## 2013-10-05 LAB — POCT PREGNANCY, URINE: Preg Test, Ur: NEGATIVE

## 2013-10-05 NOTE — ED Provider Notes (Signed)
CSN: [id number]     Arrival date & time 10/05/13  1402 History   First MD Initiated Contact with Patient 10/05/13 1419     Chief Complaint  Patient presents with  . Chest Pain   (Consider location/radiation/quality/duration/timing/severity/associated sxs/prior Treatment) HPI Comments: 33 year old Hispanic female is accompanied by her husband and presents complaining of an incident that occurred approximately one hour prior to arrival. While driving she developed pain across anterior chest and this was associated with a sense of rapid heartbeat. She became dizzy with circumoral paresthesias and breathing heavy. He the time she arrived the symptoms had abated. She has no complaints of chest pain or other symptoms. This evening it lasted approximately 2 minutes.  She also had right flank pain 2 days ago. She is concerned about a kidney stone.   Past Medical History  Diagnosis Date  . Arthritis   . Rheumatoid arthritis of the hand 11/12/2010  . Rheumatoid arthritis(714.0)   . Kidney stones    Past Surgical History  Procedure Laterality Date  . Cesarean section    . Tubal ligation     History reviewed. No pertinent family history. History  Substance Use Topics  . Smoking status: Never Smoker   . Smokeless tobacco: Not on file  . Alcohol Use: No   OB History   Grav Para Term Preterm Abortions TAB SAB Ect Mult Living   4 3 1 2      3      Review of Systems  Constitutional: Negative for fever, activity change and fatigue.  HENT: Negative.   Eyes: Negative.   Respiratory: Positive for shortness of breath. Negative for cough and wheezing.   Cardiovascular: Positive for chest pain and palpitations. Negative for leg swelling.  Gastrointestinal: Positive for nausea. Negative for vomiting.  Genitourinary: Negative.   Musculoskeletal: Negative.   Skin: Negative for color change and rash.  Neurological: Positive for light-headedness. Negative for tremors, syncope, speech difficulty and  headaches.  Psychiatric/Behavioral: The patient is nervous/anxious.     Allergies  Tamsulosin  Home Medications   Prior to Admission medications   Medication Sig Start Date End Date Taking? Authorizing Provider  FOLIC ACID PO Take 1 tablet by mouth daily.   Yes Historical Provider, MD  hydroxychloroquine (PLAQUENIL) 200 MG tablet Take 200 mg by mouth 2 (two) times daily.    Yes Historical Provider, MD  methotrexate (RHEUMATREX) 2.5 MG tablet Take 15 mg by mouth once a week. Caution:Chemotherapy. Protect from light. Takes 6 tablets each Monday.   Yes Historical Provider, MD  Prenatal Multivit-Min-Fe-FA (PRENATAL PO) Take 1 tablet by mouth daily.     Yes Historical Provider, MD  HYDROcodone-acetaminophen (NORCO/VICODIN) 5-325 MG per tablet Take 2 tablets by mouth every 4 (four) hours as needed for pain. 12/02/11   12/04/11, MD  ketorolac (TORADOL) 10 MG tablet Take 1 tablet (10 mg total) by mouth 4 (four) times daily as needed for pain. 12/02/11   12/04/11, MD  predniSONE (DELTASONE) 5 MG tablet Take 5 mg by mouth daily.    Historical Provider, MD  Tamsulosin HCl (FLOMAX) 0.4 MG CAPS Take 1 capsule (0.4 mg total) by mouth at bedtime. 12/02/11   12/04/11, MD   BP 130/83  Pulse 100  Temp(Src) 97.7 F (36.5 C) (Oral)  Resp 26  SpO2 100% Physical Exam  Nursing note and vitals reviewed. Constitutional: She is oriented to person, place, and time. She appears well-developed. No distress.  HENT:  Right Ear: External ear normal.  Left Ear: External ear normal.  Mouth/Throat: No oropharyngeal exudate.  Oropharynx with minor erythema and cobblestoning. Bilateral TMs are normal  Eyes: Conjunctivae and EOM are normal. Pupils are equal, round, and reactive to light. Right eye exhibits no discharge. Left eye exhibits no discharge.  Neck: Normal range of motion. Neck supple. No tracheal deviation present. No thyromegaly present.  Cardiovascular: Normal rate, regular rhythm,  normal heart sounds and intact distal pulses.   No murmur heard. Pulmonary/Chest: Effort normal and breath sounds normal. No respiratory distress. She has no wheezes. She has no rales. She exhibits tenderness.  Minor chest wall tenderness in the left upper anterior chest.  Abdominal: Soft. There is no tenderness. There is no rebound and no guarding.  Musculoskeletal: She exhibits no edema and no tenderness.  Lymphadenopathy:    She has no cervical adenopathy.  Neurological: She is alert and oriented to person, place, and time. No cranial nerve deficit. She exhibits normal muscle tone.  Skin: Skin is warm and dry.  Psychiatric: She has a normal mood and affect.    ED Course  Procedures (including critical care time) Labs Review Labs Reviewed  POCT URINALYSIS DIP (DEVICE) - Abnormal; Notable for the following:    Hgb urine dipstick SMALL (*)    All other components within normal limits  POCT PREGNANCY, URINE   Results for orders placed during the hospital encounter of 10/05/13  POCT URINALYSIS DIP (DEVICE)      Result Value Ref Range   Glucose, UA NEGATIVE  NEGATIVE mg/dL   Bilirubin Urine NEGATIVE  NEGATIVE   Ketones, ur NEGATIVE  NEGATIVE mg/dL   Specific Gravity, Urine 1.025  1.005 - 1.030   Hgb urine dipstick SMALL (*) NEGATIVE   pH 6.0  5.0 - 8.0   Protein, ur NEGATIVE  NEGATIVE mg/dL   Urobilinogen, UA 0.2  0.0 - 1.0 mg/dL   Nitrite NEGATIVE  NEGATIVE   Leukocytes, UA NEGATIVE  NEGATIVE  POCT PREGNANCY, URINE      Result Value Ref Range   Preg Test, Ur NEGATIVE  NEGATIVE     Imaging Review No results found. EKG: NSR, no ectopy. No ischemic changes.  MDM   1. Heart palpitations   2. Anxiety   3. History of right flank pain   4. Dizziness     Patient is asymptomatic during the exam. None of the symptoms for which she experienced earlier this afternoon are occurring at this time. She feels generally well. I suspect there is a major anxiety component to the  symptoms. It is unknown whether she developed an arrhythmia or palpitations due to anxiety.May need OP monitoring. EKG is normal It is recommended that she followup with PCP if further testing to include thyroid studies as well as investigation of possible kidney stone. She is not having symptoms for that now.    Hayden Rasmussen, NP 10/05/13 1510

## 2013-10-05 NOTE — Discharge Instructions (Signed)
Mareos (Dizziness) Los mareos son un problema muy frecuente. Se trata de una sensacin de inestabilidad o aturdimiento. Puede sentir que se va a desvanecer. Puede provocarle una lesin si se tropieza o se cae. Las Dealer de cualquier edad pueden sufrir mareos, pero es ms frecuente [employer] ancianos. CAUSAS  La causa puede deberse a muchos problemas diferentes:  Problemas en el odo medio.  Estar de pie FedEx.  Infecciones.  Reacciones alrgicas.  Envejecimiento.  Respuesta emocional a distintas cosas, como por ejemplo la visin de sangre.  Efectos secundarios de Nature conservation officer.  Cansancio.  Problemas circulatorios o de presin arterial.  Consumo excesivo de alcohol, medicamentos o drogas.  Respiracin muy rpida (hiperventilacin).  Ritmo cardaco irregular (arritmia).  Bajo recuento de glbulos rojos (anemia).  Embarazo.  Vmitos, diarrea, fiebre u otras enfermedades que causan la prdida de lquidos (deshidratacin).  Enfermedades o trastornos, como enfermedad de Parkinson, presin alta (hipertensin arterial), diabetes y problemas tiroideos.  Exposicin al calor extremo. DIAGNSTICO  El mdico le preguntar acerca de los sntomas, y Education officer, environmental un examen fsico y un electrocardiograma (ECG) para registrar la actividad elctrica del corazn. Tambin podr realizarle otras pruebas cardacas o anlisis de sangre para determinar la causa de los [place]. Estos pueden ser:  Ecocardiograma transtorcico (ETT). Durante Management consultant, se usan ondas sonoras para evaluar cmo fluye la sangre por el corazn.  Ecocardiograma transesofgico (ETE).  Monitoreo cardaco. Este estudio permite que el mdico controle la frecuencia y el ritmo cardaco en tiempo real.  Monitor Holter. Es un dispositivo porttil que [employer] latidos cardacos y [place] a Education administrator las arritmias cardacas. Le permite al [employer] registrar la actividad cardaca durante varios das, si es  necesario.  Pruebas de estrs por ejercicio o por medicamentos que aceleran los latidos cardacos. TRATAMIENTO  El tratamiento de los mareos depende de la causa de los sntomas y Advertising account planner. INSTRUCCIONES PARA EL CUIDADO EN EL HOGAR   Beba suficiente lquido para mantener la orina clara o de color amarillo plido. Esto es realmente importante cuando el clima es muy caluroso. En los adultos [place], tambin es importante cuando hace fro.  Tome los medicamentos exactamente como se lo hayan indicado, si los mareos se deben a los medicamentos. Cuando tome medicamentos para la presin arterial, es muy importante que se levante lentamente.  Levntese de las sillas con lentitud y apyese hasta sentirse bien.  Por la maana, sintese primero a un lado de la cama. Cuando se sienta bien, pngase lentamente de [address] se sostiene de algo, hasta que sepa que ha logrado el equilibrio.  Mueva las piernas con frecuencia si debe estar de pie en un lugar durante mucho tiempo. Mientras est de pie, contraiga y relaje los msculos de las piernas.  Pdale a alguna persona que lo acompae durante 1 o 2das si los mareos siguen siendo un problema. Debe estar acompaado hasta que se sienta lo suficientemente bien como para estar solo. Pdale a la persona que llame al mdico si observa cambios en usted que sean preocupantes.  No conduzca vehculos ni utilice maquinarias pesadas si se siente mareado.  No beba alcohol. SOLICITE ATENCIN MDICA DE INMEDIATO SI:   Los mareos o el aturdimiento [place].  Siente nuseas o vomita.  Tiene dificultad para hablar, para caminar o para [employer], las manos o las piernas.  Se siente dbil.  No piensa con claridad o tiene dificultades para armar oraciones. Es posible que un amigo o un familiar adviertan que esto  ocurre.  Tiene dolor de pecho, dolor abdominal, sudoracin o Company secretary.  Hay cambios en la visin.  Observa un  sangrado.  Tiene efectos secundarios del medicamento que parecen Consulting civil engineer de Scientist, clinical (histocompatibility and immunogenetics). ASEGRESE DE QUE:   Comprende estas instrucciones.  Controlar su afeccin.  Recibir ayuda de inmediato si no mejora o si empeora. Document Released: 03/01/2005 Document Revised: 03/06/2013 [facility] Patient Information 2015 [place], [place]. This informat Palpitaciones (Palpitations) Es la sensacin de sentir que el latido cardaco es irregular o es ms rpido que lo normal. Se siente como un aleteo o que falta un latido. Generalmente no es un problema grave. Sin embargo, en algunos casos podra ser necesario hacer ms estudios diagnsticos. CAUSAS  Las causas de las palpitaciones pueden ser:  Rosalva Ferron.  El consumo de cafena u otros estimulantes, como pastillas para Geophysical data processor o bebidas energizantes.  Alcohol.  Situaciones de estrs y [place].  La actividad fsica extenuante.  Fatiga.  Algunos medicamentos.  Enfermedad cardaca, especialmente si tiene antecedentes de ritmo cardaco irregular (arritmia), como fibrilacin auricular, aleteo auricular o taquicardia supraventricular.  El uso incorrecto de un marcapasos o Biochemist, clinical. DIAGNSTICO  Para hallar la causa de las palpitaciones, el mdico le har una historia clnica y un examen fsico. El mdico tambin puede hacerle un estudio llamado electrocardiograma (ECG) ambulatorio. El ECG registra el patrn de los latidos cardacos durante un perodo de 24horas. Tambin pueden hacerle otros estudios, por ejemplo:  Ecocardiograma transtorcico (ETT). Durante Management consultant, se usan ondas sonoras para evaluar cmo fluye la sangre por el corazn.  Ecocardiograma transesofgico (ETE).  Monitoreo cardaco. Este estudio permite que el mdico controle la frecuencia y el ritmo cardaco en tiempo real.  Monitor Holter. Es un dispositivo porttil que [employer] latidos cardacos y [place] a Education administrator las arritmias cardacas. Le permite  al [employer] registrar la actividad cardaca durante varios das, si es necesario.  Pruebas de estrs por ejercicio o por medicamentos que aceleran los latidos cardacos. TRATAMIENTO  El tratamiento de las palpitaciones depende de la causa y puede variar mucho. En la [employer] no se requiere otro tratamiento que esperar, Lexicographer y Pharmacologist los sntomas. Otras causas, como la fibrilacin auricular, el aleteo auricular o la taquicardia supraventricular generalmente requieren Pharmacist, community. INSTRUCCIONES PARA EL CUIDADO EN EL HOGAR   Evite:  Bebidas que contengan cafena como el caf, el t, los refrescos, las pastillas para Geophysical data processor y las bebidas energizantes.  Chocolate.  Alcohol.  Si fuma, abandone el hbito.  Reduzca los niveles de estrs y [place]. Algunas cosas que pueden ayudarlo a relajarse son:  Un mtodo para controlar el cuerpo con la mente, por ejemplo, controlar los latidos (biorregulacin).  El yoga.  La meditacin.  La actividad fsica como natacin, trote o caminatas.  Descanse y duerma lo suficiente. SOLICITE ATENCIN MDICA SI:   Contina con latidos cardacos rpidos o irregulares durante ms de 24 horas.  Las [employer] suceden con ms frecuencia. SOLICITE ATENCIN MDICA DE INMEDIATO SI:  Siente falta de aire o dolor en el pecho.  Sufre un dolor intenso de [place].  Se siente mareado o se desmaya. ASEGRESE DE QUE:  Comprende estas instrucciones.  Controlar su afeccin.  Recibir ayuda de inmediato si no mejora o si empeora. Document Released: 12/09/2004 Document Revised: 03/06/2013 [facility] Patient Information 2015 [place], [place]. This information is not intended to replace advice given to you by your health care provider. Make sure you discuss any questions you have with your health  care provider.  Crisis de [place] (Panic Attacks) Las crisis de [place] son sensaciones repentinas y Music therapist de mucho miedo o Dentist. Es  posible que experimente estas sensaciones sin ningn motivo, cuando est relajado, preocupado (ansioso) o cuando duerme.  CUIDADOS EN EL HOGAR  Tome todos los medicamentos segn las indicaciones.  Consulte a su mdico antes de comenzar a tomar nuevos medicamentos.  Cumpla con los controles mdicos segn las indicaciones. SOLICITE AYUDA SI:  No puede tomar los [employer] se lo han indicado.  Los sntomas no mejoran.  Los sntomas empeoran. SOLICITE AYUDA DE INMEDIATO SI:  Sus crisis parecen diferentes de las habituales.  Tiene pensamientos acerca de Runner, broadcasting/film/video o daar a Economist.  Toma un medicamento para las crisis de [place] y presenta efectos secundarios. ASEGRESE DE QUE:  Comprende estas instrucciones.  Controlar su afeccin.  Recibir ayuda de inmediato si no mejora o si empeora. Document Released: 06/16/2010 Document Revised: 12/20/2012 [facility] Patient Information 2015 [place], [place]. This information is not intended to replace advice given to you by your health care provider. Make sure you discuss any questions you have with your health care provider. ion is not intended to replace advice given to you by your health care provider. Make sure you discuss any questions you have with your health care provider.
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
## 2013-10-06 NOTE — ED Provider Notes (Signed)
Medical screening examination/treatment/procedure(s) were performed by resident physician or non-physician practitioner and as supervising physician I was immediately available for consultation/collaboration.   Zamari Bonsall DOUGLAS MD.   Peyton Rossner D Niralya Ohanian, MD 10/06/13 1105 

## 2014-01-14 ENCOUNTER — Encounter (HOSPITAL_COMMUNITY): Payer: Self-pay | Admitting: Emergency Medicine

## 2015-01-01 ENCOUNTER — Encounter (HOSPITAL_COMMUNITY): Payer: Self-pay | Admitting: Emergency Medicine

## 2015-01-01 ENCOUNTER — Emergency Department (HOSPITAL_COMMUNITY)
Admission: EM | Admit: 2015-01-01 | Discharge: 2015-01-01 | Disposition: A | Discharge status: Nursing Home (Includes ICF) | Payer: No Typology Code available for payment source | Source: Home / Self Care | Attending: Family Medicine | Admitting: Family Medicine

## 2015-01-01 ENCOUNTER — Inpatient Hospital Stay (HOSPITAL_COMMUNITY)
Admission: EM | Admit: 2015-01-01 | Discharge: 2015-01-03 | DRG: 596 | Disposition: A | Discharge status: Home / Self Care | Payer: Medicaid Other | Attending: Internal Medicine | Admitting: Internal Medicine

## 2015-01-01 DIAGNOSIS — B019 Varicella without complication: Secondary | ICD-10-CM | POA: Diagnosis present

## 2015-01-01 DIAGNOSIS — B028 Zoster with other complications: Secondary | ICD-10-CM | POA: Diagnosis not present

## 2015-01-01 DIAGNOSIS — R5383 Other fatigue: Secondary | ICD-10-CM | POA: Diagnosis present

## 2015-01-01 DIAGNOSIS — D72819 Decreased white blood cell count, unspecified: Secondary | ICD-10-CM | POA: Diagnosis present

## 2015-01-01 DIAGNOSIS — Z888 Allergy status to other drugs, medicaments and biological substances status: Secondary | ICD-10-CM | POA: Diagnosis not present

## 2015-01-01 DIAGNOSIS — L539 Erythematous condition, unspecified: Secondary | ICD-10-CM | POA: Diagnosis not present

## 2015-01-01 DIAGNOSIS — M069 Rheumatoid arthritis, unspecified: Secondary | ICD-10-CM | POA: Diagnosis present

## 2015-01-01 DIAGNOSIS — B029 Zoster without complications: Secondary | ICD-10-CM | POA: Diagnosis present

## 2015-01-01 DIAGNOSIS — D849 Immunodeficiency, unspecified: Secondary | ICD-10-CM

## 2015-01-01 DIAGNOSIS — E876 Hypokalemia: Secondary | ICD-10-CM | POA: Diagnosis not present

## 2015-01-01 DIAGNOSIS — B0189 Other varicella complications: Secondary | ICD-10-CM

## 2015-01-01 DIAGNOSIS — Z8739 Personal history of other diseases of the musculoskeletal system and connective tissue: Secondary | ICD-10-CM

## 2015-01-01 DIAGNOSIS — D899 Disorder involving the immune mechanism, unspecified: Secondary | ICD-10-CM

## 2015-01-01 LAB — I-STAT CHEM 8, ED
BUN: 13 mg/dL (ref 6–20)
CHLORIDE: 104 mmol/L (ref 101–111)
Calcium, Ion: 1.1 mmol/L — ABNORMAL LOW (ref 1.12–1.23)
Creatinine, Ser: 0.7 mg/dL (ref 0.44–1.00)
Glucose, Bld: 96 mg/dL (ref 65–99)
HEMATOCRIT: 43 % (ref 36.0–46.0)
Hemoglobin: 14.6 g/dL (ref 12.0–15.0)
Potassium: 3.6 mmol/L (ref 3.5–5.1)
SODIUM: 141 mmol/L (ref 135–145)
TCO2: 23 mmol/L (ref 0–100)

## 2015-01-01 LAB — CBC WITH DIFFERENTIAL/PLATELET
BASOS PCT: 1 %
Basophils Absolute: 0 10*3/uL (ref 0.0–0.1)
Eosinophils Absolute: 0 10*3/uL (ref 0.0–0.7)
Eosinophils Relative: 0 %
HEMATOCRIT: 39.5 % (ref 36.0–46.0)
HEMOGLOBIN: 13.5 g/dL (ref 12.0–15.0)
LYMPHS ABS: 0.7 10*3/uL (ref 0.7–4.0)
Lymphocytes Relative: 25 %
MCH: 31.4 pg (ref 26.0–34.0)
MCHC: 34.2 g/dL (ref 30.0–36.0)
MCV: 91.9 fL (ref 78.0–100.0)
MONOS PCT: 8 %
Monocytes Absolute: 0.3 10*3/uL (ref 0.1–1.0)
NEUTROS ABS: 2 10*3/uL (ref 1.7–7.7)
NEUTROS PCT: 66 %
PLATELETS: 135 10*3/uL — AB (ref 150–400)
RBC: 4.3 MIL/uL (ref 3.87–5.11)
RDW: 14.4 % (ref 11.5–15.5)
WBC: 3 10*3/uL — AB (ref 4.0–10.5)

## 2015-01-01 MED ORDER — DEXTROSE 5 % IV SOLN
600.0000 mg | Freq: Three times a day (TID) | INTRAVENOUS | Status: DC
  Filled 2015-01-01 (×2): qty 12

## 2015-01-01 MED ORDER — DEXTROSE 5 % IV SOLN
10.0000 mg/kg | Freq: Three times a day (TID) | INTRAVENOUS | Status: DC
  Administered 2015-01-01 – 2015-01-03 (×5): 500 mg via INTRAVENOUS
  Filled 2015-01-01 (×9): qty 10

## 2015-01-01 MED ORDER — ACETAMINOPHEN 325 MG PO TABS
650.0000 mg | ORAL_TABLET | Freq: Once | ORAL | Status: AC
  Administered 2015-01-01: 650 mg via ORAL
  Filled 2015-01-01: qty 2

## 2015-01-01 NOTE — ED Provider Notes (Signed)
CSN: [id number]     Arrival date & time 01/01/15  2123 History   First MD Initiated Contact with Patient 01/01/15 2134     No chief complaint on file.    (Consider location/radiation/quality/duration/timing/severity/associated sxs/prior Treatment) HPI Comments: Patient with 2 days of generalized malaise, fever and rash which is gradually worsening. Patient has a history of rheumatoid arthritis and is on methotrexate. Her husband had shingles last week and her symptoms started 2 days ago. She has never had chickenpox before. She went and saw her physician today who sent her here for IV antibiotics due to her immunocompromise state.  The history is provided by the patient and the spouse.    Past Medical History  Diagnosis Date  . Arthritis   . Rheumatoid arthritis of the hand 11/12/2010  . Rheumatoid arthritis(714.0)   . Kidney stones    Past Surgical History  Procedure Laterality Date  . Cesarean section    . Tubal ligation     No family history on file. Social History  Substance Use Topics  . Smoking status: Never Smoker   . Smokeless tobacco: None  . Alcohol Use: No   OB History    Gravida Para Term Preterm AB TAB SAB Ectopic Multiple Living   4 3 1 2      3      Review of Systems  Neurological: Positive for headaches.  All other systems reviewed and are negative.     Allergies  Tamsulosin  Home Medications   Prior to Admission medications   Medication Sig Start Date End Date Taking? Authorizing Provider  FOLIC ACID PO Take 1 tablet by mouth daily.    Historical Provider, MD  HYDROcodone-acetaminophen (NORCO/VICODIN) 5-325 MG per tablet Take 2 tablets by mouth every 4 (four) hours as needed for pain. 12/02/11   12/04/11, MD  hydroxychloroquine (PLAQUENIL) 200 MG tablet Take 200 mg by mouth 2 (two) times daily.     Historical Provider, MD  ketorolac (TORADOL) 10 MG tablet Take 1 tablet (10 mg total) by mouth 4 (four) times daily as needed for pain. 12/02/11    12/04/11, MD  methotrexate (RHEUMATREX) 2.5 MG tablet Take 15 mg by mouth once a week. Caution:Chemotherapy. Protect from light. Takes 6 tablets each Monday.    Historical Provider, MD  predniSONE (DELTASONE) 5 MG tablet Take 5 mg by mouth daily.    Historical Provider, MD  Prenatal Multivit-Min-Fe-FA (PRENATAL PO) Take 1 tablet by mouth daily.      Historical Provider, MD  Tamsulosin HCl (FLOMAX) 0.4 MG CAPS Take 1 capsule (0.4 mg total) by mouth at bedtime. 12/02/11   12/04/11, MD   BP 118/80 mmHg  Pulse 90  Temp(Src) 100.4 F (38 C) (Oral)  Resp 20  Ht 5\' 2"  (1.575 m)  Wt 134 lb 5 oz (60.924 kg)  BMI 24.56 kg/m2  SpO2 99%  LMP 12/24/2014 (Exact Date) Physical Exam  Constitutional: She is oriented to person, place, and time. She appears well-developed and well-nourished. No distress.  HENT:  Head: Normocephalic and atraumatic.  Mouth/Throat: Oropharynx is clear and moist.  Eyes: Conjunctivae and EOM are normal. Pupils are equal, round, and reactive to light.  Neck: Normal range of motion. Neck supple.  Cardiovascular: Normal rate, regular rhythm and intact distal pulses.   No murmur heard. Pulmonary/Chest: Effort normal and breath sounds normal. No respiratory distress. She has no wheezes. She has no rales.  Abdominal: Soft. She exhibits no distension. There is no tenderness.  There is no rebound and no guarding.  Musculoskeletal: Normal range of motion. She exhibits no edema or tenderness.  Neurological: She is alert and oriented to person, place, and time.  Skin: Skin is warm and dry. Rash noted. No erythema.  Diffuse patchy rash in multiple stages of healing with some vesicles another ruptured and crusted vesicles from the head all the way down to the feet  Psychiatric: She has a normal mood and affect. Her behavior is normal.  Nursing note and vitals reviewed.   ED Course  Procedures (including critical care time) Labs Review Labs Reviewed  I-STAT CHEM 8,  ED - Abnormal; Notable for the following:    Calcium, Ion 1.10 (*)    All other components within normal limits  CBC WITH DIFFERENTIAL/PLATELET  HIV ANTIBODY (ROUTINE TESTING)    Imaging Review No results found. I have personally reviewed and evaluated these images and lab results as part of my medical decision-making.   EKG Interpretation None      MDM   Final diagnoses:  None    Patient is a 34 year old female with evidence of chickenpox today. She was sent by her primary physician for IV acyclovir.  Patient is currently immunocompromised because she is on methotrexate for rheumatoid arthritis. Discussed patient with Dr. Zenaida Niece dam who recommended that she get 24 hours of 10 mg/kg IV acyclovir.  Patient is currently nontoxic appearing with low-grade fever. Normal vital signs. Chem-8 without acute findings. CBC and HIV pending. Patient started on acyclovir and Tylenol. Will admit for further care.    Gwyneth Sprout, MD 01/01/15 2256

## 2015-01-01 NOTE — ED Notes (Signed)
The patient presented to the [facility] with a complaint of a rash on her hands, forehead, and her side. She also said that she has been generalized body aches that started at the same time and more in her lower back. She also associated fatigue with the rash onset. She stated that the symptoms started 2 days ago.

## 2015-01-01 NOTE — ED Provider Notes (Addendum)
CSN: [id number]     Arrival date & time 01/01/15  1903 History   First MD Initiated Contact with Patient 01/01/15 2040     Chief Complaint  Patient presents with  . Rash  . Generalized Body Aches   (Consider location/radiation/quality/duration/timing/severity/associated sxs/prior Treatment) Patient is a 34 y.o. female presenting with rash. The history is provided by the patient. No language interpreter was used.  Rash Visit conducted in [place]. Patient with history of rheumatoid arthritis since age 32, controlled with MTX by rheumatology at [facility], presents with rapidly progressive painful rash for 2 days which started on L side of neck and abdomen, back, forehead.  Water-filled blisters that are painful.  Subjective fever and chills, has not felt well for past 2 days.   Patient reports that she is unsure if she has ever had chicken pox as a child.   Husband recently seen in this [facility] and treated with acyclovir for shingles.   Social Hx; Works in Stage manager. Lives with husband and their three children, two teenagers and a 50-year old. All children vaccinated for varicella. No sick contacts at home other than husband with shingles.   Past Medical History  Diagnosis Date  . Arthritis   . Rheumatoid arthritis of the hand 11/12/2010  . Rheumatoid arthritis(714.0)   . Kidney stones    Past Surgical History  Procedure Laterality Date  . Cesarean section    . Tubal ligation     History reviewed. No pertinent family history. Social History  Substance Use Topics  . Smoking status: Never Smoker   . Smokeless tobacco: None  . Alcohol Use: No   OB History    Gravida Para Term Preterm AB TAB SAB Ectopic Multiple Living   4 3 1 2      3      Review of Systems  Skin: Positive for rash.    Allergies  Tamsulosin  Home Medications   Prior to Admission medications   Medication Sig Start Date End Date Taking? Authorizing Provider  FOLIC ACID PO Take 1 tablet by mouth daily.     Historical Provider, MD  HYDROcodone-acetaminophen (NORCO/VICODIN) 5-325 MG per tablet Take 2 tablets by mouth every 4 (four) hours as needed for pain. 12/02/11   12/04/11, MD  hydroxychloroquine (PLAQUENIL) 200 MG tablet Take 200 mg by mouth 2 (two) times daily.     Historical Provider, MD  ketorolac (TORADOL) 10 MG tablet Take 1 tablet (10 mg total) by mouth 4 (four) times daily as needed for pain. 12/02/11   12/04/11, MD  methotrexate (RHEUMATREX) 2.5 MG tablet Take 15 mg by mouth once a week. Caution:Chemotherapy. Protect from light. Takes 6 tablets each Monday.    Historical Provider, MD  predniSONE (DELTASONE) 5 MG tablet Take 5 mg by mouth daily.    Historical Provider, MD  Prenatal Multivit-Min-Fe-FA (PRENATAL PO) Take 1 tablet by mouth daily.      Historical Provider, MD  Tamsulosin HCl (FLOMAX) 0.4 MG CAPS Take 1 capsule (0.4 mg total) by mouth at bedtime. 12/02/11   12/04/11, MD   Meds Ordered and Administered this Visit  Medications - No data to display  BP 126/89 mmHg  Pulse 82  Temp(Src) 98.7 F (37.1 C) (Oral)  Resp 18  SpO2 100%  LMP 12/24/2014 (Exact Date) No data found.   Physical Exam  Constitutional: She appears well-developed and well-nourished. No distress.  Generally well-appearing. No distress  Eyes: Conjunctivae are normal. Right eye exhibits no discharge. Left  eye exhibits no discharge.  Neck: Normal range of motion. Neck supple.  Pulmonary/Chest: Effort normal and breath sounds normal. No respiratory distress.  Lymphadenopathy:    She has no cervical adenopathy.  Skin: She is not diaphoretic.  Scattered "dew drops on rose petal" with erythematous base, clear fluid-filled vesicles on trunk, neck, back and forehead. A few which are deroofed.     ED Course  Procedures (including critical care time)  Labs Review Labs Reviewed - No data to display  Imaging Review No results found.   Visual Acuity Review  Right Eye Distance:    Left Eye Distance:   Bilateral Distance:    Right Eye Near:   Left Eye Near:    Bilateral Near:         MDM   1. Exanthem due to chicken pox   2. History of rheumatoid arthritis    Patient with clinical diagnosis of chicken pox, no prior exposure to chicken pox known.  She is considered immunosuppressed, given RA history and treatment with MTX.  Known exposure to shingles in household.  Recommendation for IV treatment with acyclovir, I discussed this recommendation that patient be transferred to ED and admitted for IV antiviral treatment.  Patient and husband in agreement with plan for transfer/admission.     Barbaraann Barthel, MD 01/01/15 2121  Barbaraann Barthel, MD 01/01/15 2122

## 2015-01-01 NOTE — ED Notes (Signed)
Pt. presents with vesicular rashes at forehead , face , neck and torso with body aches and fever onset 2 days ago , seen at urgent care this evening diagnosed with chicken pox . Respirations unlabored / no cough or congestion .

## 2015-01-02 ENCOUNTER — Encounter (HOSPITAL_COMMUNITY): Payer: Self-pay | Admitting: Internal Medicine

## 2015-01-02 DIAGNOSIS — E876 Hypokalemia: Secondary | ICD-10-CM | POA: Diagnosis not present

## 2015-01-02 DIAGNOSIS — B019 Varicella without complication: Secondary | ICD-10-CM | POA: Diagnosis present

## 2015-01-02 DIAGNOSIS — R5383 Other fatigue: Secondary | ICD-10-CM | POA: Diagnosis present

## 2015-01-02 DIAGNOSIS — M069 Rheumatoid arthritis, unspecified: Secondary | ICD-10-CM | POA: Diagnosis present

## 2015-01-02 DIAGNOSIS — D72819 Decreased white blood cell count, unspecified: Secondary | ICD-10-CM | POA: Diagnosis present

## 2015-01-02 DIAGNOSIS — Z888 Allergy status to other drugs, medicaments and biological substances status: Secondary | ICD-10-CM | POA: Diagnosis not present

## 2015-01-02 DIAGNOSIS — B028 Zoster with other complications: Secondary | ICD-10-CM | POA: Diagnosis present

## 2015-01-02 DIAGNOSIS — L539 Erythematous condition, unspecified: Secondary | ICD-10-CM | POA: Diagnosis present

## 2015-01-02 DIAGNOSIS — B029 Zoster without complications: Principal | ICD-10-CM

## 2015-01-02 LAB — COMPREHENSIVE METABOLIC PANEL
ALK PHOS: 40 U/L (ref 38–126)
ALT: 33 U/L (ref 14–54)
ANION GAP: 9 (ref 5–15)
AST: 51 U/L — ABNORMAL HIGH (ref 15–41)
Albumin: 3.4 g/dL — ABNORMAL LOW (ref 3.5–5.0)
BUN: 7 mg/dL (ref 6–20)
CO2: 24 mmol/L (ref 22–32)
CREATININE: 0.75 mg/dL (ref 0.44–1.00)
Calcium: 8.7 mg/dL — ABNORMAL LOW (ref 8.9–10.3)
Chloride: 104 mmol/L (ref 101–111)
GLUCOSE: 143 mg/dL — AB (ref 65–99)
Potassium: 3.1 mmol/L — ABNORMAL LOW (ref 3.5–5.1)
SODIUM: 137 mmol/L (ref 135–145)
TOTAL PROTEIN: 6.1 g/dL — AB (ref 6.5–8.1)
Total Bilirubin: 0.1 mg/dL — ABNORMAL LOW (ref 0.3–1.2)

## 2015-01-02 LAB — CBC WITH DIFFERENTIAL/PLATELET
BASOS ABS: 0 10*3/uL (ref 0.0–0.1)
Basophils Relative: 1 %
EOS ABS: 0 10*3/uL (ref 0.0–0.7)
EOS PCT: 0 %
HCT: 38.3 % (ref 36.0–46.0)
Hemoglobin: 12.5 g/dL (ref 12.0–15.0)
LYMPHS ABS: 0.6 10*3/uL — AB (ref 0.7–4.0)
LYMPHS PCT: 22 %
MCH: 30.1 pg (ref 26.0–34.0)
MCHC: 32.6 g/dL (ref 30.0–36.0)
MCV: 92.3 fL (ref 78.0–100.0)
MONO ABS: 0.2 10*3/uL (ref 0.1–1.0)
Monocytes Relative: 7 %
Neutro Abs: 2 10*3/uL (ref 1.7–7.7)
Neutrophils Relative %: 71 %
PLATELETS: 118 10*3/uL — AB (ref 150–400)
RBC: 4.15 MIL/uL (ref 3.87–5.11)
RDW: 14.4 % (ref 11.5–15.5)
WBC: 2.9 10*3/uL — ABNORMAL LOW (ref 4.0–10.5)

## 2015-01-02 LAB — HIV ANTIBODY (ROUTINE TESTING W REFLEX): HIV SCREEN 4TH GENERATION: NONREACTIVE

## 2015-01-02 LAB — PHOSPHORUS: Phosphorus: 3.4 mg/dL (ref 2.5–4.6)

## 2015-01-02 LAB — GLUCOSE, CAPILLARY: GLUCOSE-CAPILLARY: 87 mg/dL (ref 65–99)

## 2015-01-02 LAB — MAGNESIUM: Magnesium: 1.9 mg/dL (ref 1.7–2.4)

## 2015-01-02 MED ORDER — FOLIC ACID 1 MG PO TABS
1.0000 mg | ORAL_TABLET | Freq: Every day | ORAL | Status: DC
  Administered 2015-01-02 – 2015-01-03 (×2): 1 mg via ORAL
  Filled 2015-01-02 (×2): qty 1

## 2015-01-02 MED ORDER — POTASSIUM CHLORIDE IN NACL 20-0.9 MEQ/L-% IV SOLN
INTRAVENOUS | Status: DC
  Administered 2015-01-02 – 2015-01-03 (×2): via INTRAVENOUS
  Filled 2015-01-02 (×3): qty 1000

## 2015-01-02 MED ORDER — FAMOTIDINE 20 MG PO TABS
20.0000 mg | ORAL_TABLET | Freq: Two times a day (BID) | ORAL | Status: DC
  Administered 2015-01-02 – 2015-01-03 (×4): 20 mg via ORAL
  Filled 2015-01-02 (×4): qty 1

## 2015-01-02 MED ORDER — DIPHENHYDRAMINE HCL 50 MG/ML IJ SOLN
25.0000 mg | Freq: Four times a day (QID) | INTRAMUSCULAR | Status: DC | PRN
  Administered 2015-01-02: 25 mg via INTRAVENOUS
  Filled 2015-01-02: qty 1

## 2015-01-02 MED ORDER — ENOXAPARIN SODIUM 40 MG/0.4ML ~~LOC~~ SOLN
40.0000 mg | Freq: Every day | SUBCUTANEOUS | Status: DC
  Administered 2015-01-02 – 2015-01-03 (×2): 40 mg via SUBCUTANEOUS
  Filled 2015-01-02 (×3): qty 0.4

## 2015-01-02 MED ORDER — POTASSIUM CHLORIDE CRYS ER 20 MEQ PO TBCR
40.0000 meq | EXTENDED_RELEASE_TABLET | Freq: Once | ORAL | Status: AC
  Administered 2015-01-02: 40 meq via ORAL
  Filled 2015-01-02: qty 2

## 2015-01-02 MED ORDER — KETOROLAC TROMETHAMINE 30 MG/ML IJ SOLN
30.0000 mg | Freq: Three times a day (TID) | INTRAMUSCULAR | Status: DC | PRN
  Administered 2015-01-02 (×2): 30 mg via INTRAVENOUS
  Filled 2015-01-02 (×3): qty 1

## 2015-01-02 MED ORDER — INFLUENZA VAC SPLIT QUAD 0.5 ML IM SUSY
0.5000 mL | PREFILLED_SYRINGE | INTRAMUSCULAR | Status: DC
  Filled 2015-01-02: qty 0.5

## 2015-01-02 NOTE — H&P (Signed)
[facility] History and Physical  Joliene Salvador TJQ:[id number] DOB: 03/02/1981 DOA: 01/01/2015  Referring physician: Gwyneth Sprout, MD PCP: No primary care provider on file.   Chief Complaint: "Chickenpox"  HPI: Patricia Tate is a 34 y.o. female with a PMH of rheumatoid arthritis who is currently on methotrexate who comes with worsening fever, chills, fatigue, headache and erythematous rash for 2 days. She does not have a history of varicella zoster virus infection in the past, but her spouse was recently diagnosed and treated for herpes zoster infection. She also complains of nausea, but denies emesis, diarrhea, constipation, melena or hematochezia. She denies sore throat, productive cough or GU symptoms. She and her husband live with her 3 children, and at this time, it is unknown third vaccination status. She went to see her physician today. Given her immunosuppressed status, she was referred for IV antiviral medication.  When seen in the emergency department patient was in no acute distress, but still complaining of fatigue, body aches, headaches and nausea.   Review of Systems:  Constitutional:  Positive Fevers, chills, fatigue.  No weight loss, night sweats,  HEENT:  Positive headaches. Denies difficulty swallowing,Tooth/dental problems,Sore throat,  No sneezing, itching, ear ache, nasal congestion, post nasal drip,  Cardio-vascular:  No chest pain, Orthopnea, PND, swelling in lower extremities, anasarca, dizziness, palpitations  GI:  Positive nausea No heartburn, indigestion, abdominal pain, vomiting, diarrhea, change in bowel habits, loss of appetite  Resp:  No shortness of breath with exertion or at rest. No excess mucus, no productive cough, No non-productive cough, No coughing up of blood.No change in color of mucus.No wheezing.No chest wall deformity  Skin:  Positive erythematous rash. GU:  no dysuria, change in color of urine, no urgency  or frequency. No flank pain.  Musculoskeletal:  Occasional joint pain or swelling. No decreased range of motion. No back pain.  Psych:  No change in mood or affect. No depression or anxiety. No memory loss.   Past Medical History  Diagnosis Date  . Arthritis   . Rheumatoid arthritis of the hand 11/12/2010  . Rheumatoid arthritis(714.0)   . Kidney stones    Past Surgical History  Procedure Laterality Date  . Cesarean section    . Tubal ligation     Social History:  reports that she has never smoked. She does not have any smokeless tobacco history on file. She reports that she does not drink alcohol or use illicit drugs.  Allergies  Allergen Reactions  . Tamsulosin Swelling    Family History  Problem Relation Age of Onset  . Rheum arthritis Sister      Prior to Admission medications   Medication Sig Start Date End Date Taking? Authorizing Provider  FOLIC ACID PO Take 1 tablet by mouth daily.    Historical Provider, MD  HYDROcodone-acetaminophen (NORCO/VICODIN) 5-325 MG per tablet Take 2 tablets by mouth every 4 (four) hours as needed for pain. 12/02/11   Domenick Gong, MD  hydroxychloroquine (PLAQUENIL) 200 MG tablet Take 200 mg by mouth 2 (two) times daily.     Historical Provider, MD  ketorolac (TORADOL) 10 MG tablet Take 1 tablet (10 mg total) by mouth 4 (four) times daily as needed for pain. 12/02/11   Domenick Gong, MD  methotrexate (RHEUMATREX) 2.5 MG tablet Take 15 mg by mouth once a week. Caution:Chemotherapy. Protect from light. Takes 6 tablets each Monday.    Historical Provider, MD  predniSONE (DELTASONE) 5 MG tablet Take 5 mg by mouth daily.  Historical Provider, MD  Prenatal Multivit-Min-Fe-FA (PRENATAL PO) Take 1 tablet by mouth daily.      Historical Provider, MD  Tamsulosin HCl (FLOMAX) 0.4 MG CAPS Take 1 capsule (0.4 mg total) by mouth at bedtime. 12/02/11   Domenick Gong, MD   Physical Exam: Filed Vitals:   01/01/15 2215 01/01/15 2245 01/01/15 2315  01/02/15 0000  BP: 118/80 124/84 116/76 114/76  Pulse: 90 90 94 102  Temp:      TempSrc:      Resp:      Height:      Weight:      SpO2: 99% 100% 100% 100%    Wt Readings from Last 3 Encounters:  01/01/15 60.924 kg (134 lb 5 oz)  11/12/10 70.806 kg (156 lb 1.6 oz)  09/23/10 70.943 kg (156 lb 6.4 oz)    General:  Appears calm and comfortable Eyes: PERRL, normal lids, irises & conjunctiva ENT: grossly normal hearing, lips & tongue Neck: no LAD, masses or thyromegaly Cardiovascular: RRR, no m/r/g. No LE edema. Telemetry: SR, no arrhythmias  Respiratory: CTA bilaterally, no w/r/r. Normal respiratory effort. Abdomen: soft, ntnd Skin: Positive erythematous rash on extremities and trunk. Musculoskeletal: grossly normal tone BUE/BLE Psychiatric: grossly normal mood and affect, speech fluent and appropriate Neurologic: grossly non-focal.          Labs on Admission:  Basic Metabolic Panel:  Recent Labs Lab 01/01/15 2241  NA 141  K 3.6  CL 104  GLUCOSE 96  BUN 13  CREATININE 0.70   CBC:   Recent Labs Lab 01/01/15 2200 01/01/15 2241  WBC 3.0*  --   NEUTROABS 2.0  --   HGB 13.5 14.6  HCT 39.5 43.0  MCV 91.9  --   PLT 135*  --     Assessment/Plan Principal Problem:   Varicella-zoster virus infection Continue IV acyclovir for 24 hours as suggested by infectious diseases. Tylenol and Toradol when necessary for fever and pain. Benadryl as needed for itching and nausea. I advised the patient and her husband to check the vaccination status of their children with their pediatrician.  Active Problems:   Rheumatoid arthritis (HCC) On methotrexate. Patient to hold off until varicella-zoster episode results. Check LFTs.    Leukopenia Monitor WBCs.  Infectious diseases was consulted by the emergency department.  (Dr. Zenaida Niece dam)  Code Status: Full code. DVT Prophylaxis: Lovenox SQ. Family Communication:  Disposition Plan: Admit for IV acyclovir therapy.  Time  spent: Over 70 minutes were spent during the process of this admission.  Bobette Mo [facility] Pager [phone].

## 2015-01-02 NOTE — Plan of Care (Signed)
Problem: Phase I Progression Outcomes Goal: Pain controlled with appropriate interventions Outcome: Completed/Met Date Met:  01/02/15 No complaints of pain     

## 2015-01-02 NOTE — Progress Notes (Signed)
[facility] PROGRESS NOTE  Patricia Tate PJA:[id number] DOB: July 12, 1980 DOA: 01/01/2015  PCP: No primary care provider on file.  Brief HPI: 34 year old female of Hispanic origin with a past medical history of rheumatoid arthritis on methotrexate, presented with fever, chills, fatigue, and an erythematous rash. There was history of exposure to herpes zoster recently. Her skin lesions were suggestive of acute varicella-zoster infection. Due to her immunocompromised status, she was hospitalized for further management.  Past medical history:  Past Medical History  Diagnosis Date  . Arthritis   . Rheumatoid arthritis of the hand 11/12/2010  . Rheumatoid arthritis(714.0)   . Kidney stones     Consultants: Phone discussion with ID  Procedures: None  Antibiotics: Intravenous acyclovir  Subjective: Patient denies any shortness of breath. No cough. Denies any nausea or vomiting or abdominal pain. Headache is improved. Some of the skin lesions are painful.  Objective: Vital Signs  Filed Vitals:   01/02/15 0142 01/02/15 0625 01/02/15 0830 01/02/15 0943  BP: 112/76 103/59 111/76   Pulse: 86 90 100   Temp: 100 F (37.8 C) 102.5 F (39.2 C) 102.6 F (39.2 C) 102.4 F (39.1 C)  TempSrc: Oral Oral Oral Oral  Resp: 20 20 20    Height:      Weight:      SpO2:  100% 100%     Intake/Output Summary (Last 24 hours) at 01/02/15 1051 Last data filed at 01/02/15 0942  Gross per 24 hour  Intake 1098.92 ml  Output    650 ml  Net 448.92 ml   Filed Weights   01/01/15 2132  Weight: 60.924 kg (134 lb 5 oz)    General appearance: alert, cooperative, appears stated age and no distress Resp: clear to auscultation bilaterally Cardio: regular rate and rhythm, S1, S2 normal, no murmur, click, rub or gallop GI: soft, non-tender; bowel sounds normal; no masses,  no organomegaly Extremities: extremities normal, atraumatic, no cyanosis or edema Skin: Erythematous rash on  extremities and trunk. Some fluid-filled vesicles also noted. Neurologic: No focal deficits  Lab Results:  Basic Metabolic Panel:  Recent Labs Lab 01/01/15 2241 01/02/15 0400  NA 141 137  K 3.6 3.1*  CL 104 104  CO2  --  24  GLUCOSE 96 143*  BUN 13 7  CREATININE 0.70 0.75  CALCIUM  --  8.7*  MG  --  1.9  PHOS  --  3.4   Liver Function Tests:  Recent Labs Lab 01/02/15 0400  AST 51*  ALT 33  ALKPHOS 40  BILITOT 0.1*  PROT 6.1*  ALBUMIN 3.4*   CBC:  Recent Labs Lab 01/01/15 2200 01/01/15 2241 01/02/15 0400  WBC 3.0*  --  2.9*  NEUTROABS 2.0  --  2.0  HGB 13.5 14.6 12.5  HCT 39.5 43.0 38.3  MCV 91.9  --  92.3  PLT 135*  --  118*     Studies/Results: No results found.  Medications:  Scheduled: . acyclovir  10 mg/kg (Ideal) Intravenous 3 times per day  . enoxaparin (LOVENOX) injection  40 mg Subcutaneous Daily  . famotidine  20 mg Oral BID  . folic acid  1 mg Oral Daily  . [START ON 01/03/2015] Influenza vac split quadrivalent PF  0.5 mL Intramuscular Tomorrow-1000   Continuous: . 0.9 % NaCl with KCl 20 mEq / L 75 mL/hr at 01/02/15 1217   1218, ketorolac  Assessment/Plan:  Principal Problem:   Varicella-zoster virus infection Active Problems:   Rheumatoid arthritis (HCC)  Leukopenia    Varicella-zoster Continue IV acyclovir as suggested by infectious disease. Patient continues to have fever. Continue Tylenol as needed.  History of rheumatoid arthritis Patient was on methotrexate, hydroxychloroquine. Methotrexate on hold currently due to acute infection.  Hypokalemia Replete potassium.  DVT Prophylaxis: Lovenox    Code Status: Full code  Family Communication: Discussed with the patient and her husband  Disposition Plan: Anticipate discharge home the next 24-48 hours.     LOS: 0 days   [facility]  [facility] Pager [phone] 01/02/2015, 10:51 AM  If 7PM-7AM, please contact night-coverage at  [email or web address], password [facility]

## 2015-01-03 LAB — CBC
HCT: 37.7 % (ref 36.0–46.0)
HEMOGLOBIN: 12.3 g/dL (ref 12.0–15.0)
MCH: 30.7 pg (ref 26.0–34.0)
MCHC: 32.6 g/dL (ref 30.0–36.0)
MCV: 94 fL (ref 78.0–100.0)
Platelets: 104 10*3/uL — ABNORMAL LOW (ref 150–400)
RBC: 4.01 MIL/uL (ref 3.87–5.11)
RDW: 14.8 % (ref 11.5–15.5)
WBC: 3.2 10*3/uL — ABNORMAL LOW (ref 4.0–10.5)

## 2015-01-03 LAB — COMPREHENSIVE METABOLIC PANEL
ALT: 41 U/L (ref 14–54)
ANION GAP: 4 — AB (ref 5–15)
AST: 43 U/L — ABNORMAL HIGH (ref 15–41)
Albumin: 3.1 g/dL — ABNORMAL LOW (ref 3.5–5.0)
Alkaline Phosphatase: 37 U/L — ABNORMAL LOW (ref 38–126)
BUN: 6 mg/dL (ref 6–20)
CHLORIDE: 107 mmol/L (ref 101–111)
CO2: 26 mmol/L (ref 22–32)
CREATININE: 0.77 mg/dL (ref 0.44–1.00)
Calcium: 8.3 mg/dL — ABNORMAL LOW (ref 8.9–10.3)
GFR calc non Af Amer: >60 mL/min (ref >60)
Glucose, Bld: 97 mg/dL (ref 65–99)
Potassium: 4.5 mmol/L (ref 3.5–5.1)
SODIUM: 137 mmol/L (ref 135–145)
Total Bilirubin: 0.3 mg/dL (ref 0.3–1.2)
Total Protein: 5.8 g/dL — ABNORMAL LOW (ref 6.5–8.1)

## 2015-01-03 MED ORDER — ACETAMINOPHEN 325 MG PO TABS
650.0000 mg | ORAL_TABLET | Freq: Four times a day (QID) | ORAL | Status: DC | PRN
  Administered 2015-01-03: 650 mg via ORAL
  Filled 2015-01-03: qty 2

## 2015-01-03 MED ORDER — VALACYCLOVIR HCL 1 G PO TABS: 1000.0000 mg | ORAL_TABLET | Freq: Three times a day (TID) | ORAL | Status: AC

## 2015-01-03 MED ORDER — ACETAMINOPHEN 325 MG PO TABS: 650.0000 mg | ORAL_TABLET | Freq: Four times a day (QID) | ORAL | Status: AC | PRN

## 2015-01-03 MED ORDER — METHOTREXATE 2.5 MG PO TABS: 15.0000 mg | ORAL_TABLET | ORAL | Status: AC

## 2015-01-03 MED ORDER — KETOROLAC TROMETHAMINE 10 MG PO TABS: 10.0000 mg | ORAL_TABLET | Freq: Four times a day (QID) | ORAL | Status: AC | PRN

## 2015-01-03 MED ORDER — VALACYCLOVIR HCL 500 MG PO TABS
1000.0000 mg | ORAL_TABLET | Freq: Three times a day (TID) | ORAL | Status: DC
  Filled 2015-01-03 (×3): qty 2

## 2015-01-03 NOTE — Progress Notes (Signed)
Spanish Interpretor called to discuss discharge planning / medication - awaiting for call back time from [employer]. Abelino Derrick [facility] [phone]

## 2015-01-03 NOTE — Progress Notes (Signed)
Talked to patient with interpretor present concerning DCP Patient is active with the the [facility]) / has an orange card / goes to a Center on [address] for medical care and also gets her prescriptions filled there at a discounted cost. CM informed patient at discharge to get her prescription filled today. Abelino Derrick [facility] [phone]

## 2015-01-03 NOTE — Discharge Instructions (Signed)
Varicela - Adultos  (Chickenpox, Adult)  La varicela es una enfermedad causada por un virus. La varicela puede ser muy grave en los adultos, quienes tienen mayor riesgo de complicaciones que los niños. Entre las complicaciones de la varicela, se incluyen:  · Neumonía.  · Infección en la piel.  · Infección en los huesos (osteomielitis).  · Infección en las articulaciones (artritis séptica).  · Infección en el cerebro (encefalitis).  · Síndrome de shock tóxico.  · Problemas de sangrado.  · Problemas de equilibrio y control muscular (ataxia cerebelosa).  · Nacimiento de un bebé con defectos congénitos si está embarazada.  · Muerte.  Si usted ya ha tenido varicela antes, es probable que no la tenga de nuevo. Si está expuesto a la varicela y nunca ha tenido la enfermedad ni se ha colocado la vacuna, puede enfermarse en el transcurso de 21 días.  CAUSAS   La varicela es causada por un virus. El virus se propaga fácilmente de una persona a la otra a través de las diminutas gotas que quedan en el aire cuando una persona infectada tose o estornuda. También se propaga a través del líquido que produce la erupción cutánea por varicela.  FACTORES DE RIESGO  Están en riesgo las personas que nunca han tenido varicela y que nunca se han vacunado. Usted también puede correr más riesgo de varicela si:  · Es trabajador de la salud.  · Es estudiante universitario.  · Trabaja en las [place].  · Vive en una institución.  · Es maestro.  · Tiene poca resistencia a las infecciones.  SIGNOS Y SÍNTOMAS   · Erupción. En esta erupción, aparecen ampollas que pican y al cicatrizar forman una costra. La erupción cutánea puede aparecer uno o dos días después de otros síntomas.  · Dolores y molestias corporales.  · Dolor de cabeza.  · Irritabilidad.  · Cansancio.  · Fiebre.  · Dolor de garganta.  DIAGNÓSTICO   El médico hará el diagnóstico en función de sus síntomas. Pueden hacerle un análisis de sangre para confirmar el  diagnóstico.  TRATAMIENTO   Los tratamientos pueden incluir:  · Tomar un medicamento para acortar la enfermedad y reducir su gravedad.  · Aplicar una loción de calamina para aliviar la picazón.  · Usar bicarbonato de sodio o baños de avena para calmar la picazón en la piel.  Pregúntele al médico si puede usar un medicamento de venta libre llamado antihistamínico para reducir la picazón.  INSTRUCCIONES PARA EL CUIDADO EN EL HOGAR    · Tome los medicamentos solamente como se lo haya indicado el médico.  · Intente tomar un baño de inmersión en agua templada con frecuencia. Agregue varias cucharadas de bicarbonato de sodio o avena en el agua para que el baño tenga un efecto más calmante. No se bañe con agua caliente.  · Aplique una bolsa de hielo o un paño frío sobre la erupción cutánea para aliviar la picazón.  · Lávese las manos con frecuencia. Esto reduce la probabilidad de contraer una infección bacteriana en la piel y de transmitir el virus a otras personas.  · No coma ni beba nada condimentado, salado o ácido si tiene ampollas en la boca. Las bebidas y los alimentos blandos, suaves y fríos serán más fáciles de tragar.  · No esté cerca de:    Personas que no han tenido varicela y que no se han vacunado contra esta enfermedad.    Personas que tengan el sistema inmunitario débil, por ejemplo, quienes tienen VIH,   sida o cáncer; quienes han tenido un trasplante o reciben quimioterapia; y quienes toman medicamentos que debilitan el sistema inmunitario.    Embarazadas.  · Si usted tiene varicela y está en contacto con una persona que no tenido antes esta enfermedad o no se ha vacunado contra ella, que tiene el sistema inmunitario débil o que está embarazada, esa persona debe llamar a un médico.  SOLICITE ATENCIÓN MÉDICA DE INMEDIATO SI:   · Tiene dificultad para respirar.  · Sufre un dolor intenso de cabeza.  · Presenta rigidez en el cuello.  · Presenta dolor intenso o rigidez en las articulaciones.  · Se siente  desorientado o confundido.  · Tiene dificultad para caminar o mantener el equilibrio.  · Tiene fiebre y los síntomas empeoran repentinamente.  · El área alrededor de una ampolla supura pus o está muy roja, caliente al tacto o dolorida.  ASEGÚRESE DE QUE:  · Comprende estas instrucciones.  · Controlará su afección.  · Recibirá ayuda de inmediato si no mejora o si empeora.     Esta información no tiene como fin reemplazar el consejo del médico. Asegúrese de hacerle al médico cualquier pregunta que tenga.     Document Released: 05/28/2008 Document Revised: 03/22/2014  Elsevier Interactive Patient Education ©2016 [employer]

## 2015-01-03 NOTE — Discharge Summary (Signed)
[facility]  Physician Discharge Summary   Patient ID: Patricia Tate MRN: [id number] DOB/AGE: 1980/06/02 34 y.o.  Admit date: 01/01/2015 Discharge date: 01/03/2015  PCP: No primary care Talha Iser on file.  DISCHARGE DIAGNOSES:  Principal Problem:   Varicella-zoster virus infection Active Problems:   Rheumatoid arthritis (HCC)   Leukopenia   Hypokalemia   RECOMMENDATIONS FOR OUTPATIENT FOLLOW UP: 1. Patient instructed to avoid social contact for at least 2 weeks or until all the lesions have crusted over and she is afebrile.   DISCHARGE CONDITION: fair  Diet recommendation: Regular  Filed Weights   01/01/15 2132 01/03/15 0627  Weight: 60.924 kg (134 lb 5 oz) 62.143 kg (137 lb)    INITIAL HISTORY: 34 year old female of Hispanic origin with a past medical history of rheumatoid arthritis on methotrexate, presented with fever, chills, fatigue, and an erythematous rash. There was history of exposure to herpes zoster recently. Her skin lesions were suggestive of acute varicella-zoster infection. Due to her immunocompromised status, she was hospitalized for further management.  Consultations:  Phone discussions with infectious disease specialists   HOSPITAL COURSE:   Varicella-zoster Due to her immunocompromised status patient was hospitalized and placed on IV acyclovir per discussions with ID. This was given for 24 hours. Transitioned to oral valacyclovir today. Continues to have fever which may continue, in her case, for at least a few more days. Patient, however, is nontoxic. She denies any shortness of breath. Fever can be treated with Tylenol or Toradol at home. She will follow-up with her primary care Dieter Hane once her acute infection has subsided.   History of rheumatoid arthritis Patient was on methotrexate, hydroxychloroquine. Methotrexate on hold currently due to acute infection. Could be resumed in 2 weeks.  Hypokalemia Potassium was  repleted.  Patient remained stable. Okay for discharge today.   PERTINENT LABS:  The results of significant diagnostics from this hospitalization (including imaging, microbiology, ancillary and laboratory) are listed below for reference.     Labs: Basic Metabolic Panel:  Recent Labs Lab 01/01/15 2241 01/02/15 0400 01/03/15 0230  NA 141 137 137  K 3.6 3.1* 4.5  CL 104 104 107  CO2  --  24 26  GLUCOSE 96 143* 97  BUN 13 7 6   CREATININE 0.70 0.75 0.77  CALCIUM  --  8.7* 8.3*  MG  --  1.9  --   PHOS  --  3.4  --    Liver Function Tests:  Recent Labs Lab 01/02/15 0400 01/03/15 0230  AST 51* 43*  ALT 33 41  ALKPHOS 40 37*  BILITOT 0.1* 0.3  PROT 6.1* 5.8*  ALBUMIN 3.4* 3.1*   CBC:  Recent Labs Lab 01/01/15 2200 01/01/15 2241 01/02/15 0400 01/03/15 0230  WBC 3.0*  --  2.9* 3.2*  NEUTROABS 2.0  --  2.0  --   HGB 13.5 14.6 12.5 12.3  HCT 39.5 43.0 38.3 37.7  MCV 91.9  --  92.3 94.0  PLT 135*  --  118* 104*    CBG:  Recent Labs Lab 01/02/15 1127  GLUCAP 87     IMAGING STUDIES No results found.  DISCHARGE EXAMINATION: Filed Vitals:   01/02/15 1738 01/02/15 2100 01/03/15 0627 01/03/15 0700  BP: 112/69 99/62 117/71   Pulse: 90 84 80   Temp: 102.1 F (38.9 C) 98.6 F (37 C) 101.3 F (38.5 C) 99.2 F (37.3 C)  TempSrc: Oral Oral Oral Oral  Resp: 20 18 19    Height:      Weight:  62.143 kg (137 lb)   SpO2: 100% 100% 98%    General appearance: alert, cooperative, appears stated age and no distress Resp: clear to auscultation bilaterally Cardio: regular rate and rhythm, S1, S2 normal, no murmur, click, rub or gallop GI: soft, non-tender; bowel sounds normal; no masses,  no organomegaly Extremities: extremities normal, atraumatic, no cyanosis or edema Vesicular lesions noted throughout body.  DISPOSITION: Home  Discharge Instructions    Call MD for:  difficulty breathing, headache or visual disturbances    Complete by:  As directed       Call MD for:  extreme fatigue    Complete by:  As directed      Call MD for:  persistant dizziness or light-headedness    Complete by:  As directed      Call MD for:  persistant nausea and vomiting    Complete by:  As directed      Call MD for:  severe uncontrolled pain    Complete by:  As directed      Diet general    Complete by:  As directed      Discharge instructions    Complete by:  As directed   Please do not return to work or go outside till all the lesions have crusted over and fever has resolved. Take Tylenol or Toradol for fever as instructed.  You were cared for by a hospitalist during your hospital stay. If you have any questions about your discharge medications or the care you received while you were in the hospital after you are discharged, you can call the unit and asked to speak with the hospitalist on call if the hospitalist that took care of you is not available. Once you are discharged, your primary care physician will handle any further medical issues. Please note that NO REFILLS for any discharge medications will be authorized once you are discharged, as it is imperative that you return to your primary care physician (or establish a relationship with a primary care physician if you do not have one) for your aftercare needs so that they can reassess your need for medications and monitor your lab values. If you do not have a primary care physician, you can call [phone] for a physician referral.     Increase activity slowly    Complete by:  As directed      Other Restrictions    Complete by:  As directed   Avoid social contact till lesions have crusted over and fever has resolved.           ALLERGIES:  Allergies  Allergen Reactions  . Tamsulosin Swelling      Current Discharge Medication List    START taking these medications   Details  acetaminophen (TYLENOL) 325 MG tablet Take 2 tablets (650 mg total) by mouth every 6 (six) hours as needed for mild pain or  fever.    valACYclovir (VALTREX) 1000 MG tablet Take 1 tablet (1,000 mg total) by mouth 3 (three) times daily. For 12 more days Qty: 36 tablet, Refills: 0      CONTINUE these medications which have CHANGED   Details  ketorolac (TORADOL) 10 MG tablet Take 1 tablet (10 mg total) by mouth 4 (four) times daily as needed. Qty: 20 tablet, Refills: 0    methotrexate (RHEUMATREX) 2.5 MG tablet Take 6 tablets (15 mg total) by mouth once a week. Caution:Chemotherapy. Protect from light. Takes 6 tablets each Monday.  PLEASE RESUME FROM THE WEEK OF  01/13/15 Qty: 4 tablet, Refills: 0      CONTINUE these medications which have NOT CHANGED   Details  FOLIC ACID PO Take 1 tablet by mouth daily.    Prenatal Multivit-Min-Fe-FA (PRENATAL PO) Take 1 tablet by mouth daily.      HYDROcodone-acetaminophen (NORCO/VICODIN) 5-325 MG per tablet Take 2 tablets by mouth every 4 (four) hours as needed for pain. Qty: 20 tablet, Refills: 0    hydroxychloroquine (PLAQUENIL) 200 MG tablet Take 200 mg by mouth 2 (two) times daily.     predniSONE (DELTASONE) 5 MG tablet Take 5 mg by mouth daily.      STOP taking these medications     Tamsulosin HCl (FLOMAX) 0.4 MG CAPS        Follow-up Information    Schedule an appointment as soon as possible for a visit in 10 days to follow up.   Why:  post hospitalization follow up      TOTAL DISCHARGE TIME: 35 minutes  [facility]  [facility] Pager [phone]  01/03/2015, 2:16 PM

## 2015-10-21 ENCOUNTER — Encounter (HOSPITAL_COMMUNITY): Payer: Self-pay | Admitting: Neurology

## 2015-10-21 ENCOUNTER — Emergency Department (HOSPITAL_COMMUNITY): Payer: Self-pay

## 2015-10-21 ENCOUNTER — Emergency Department (HOSPITAL_COMMUNITY)
Admission: EM | Admit: 2015-10-21 | Discharge: 2015-10-21 | Disposition: A | Discharge status: Home / Self Care | Payer: Self-pay | Attending: Emergency Medicine | Admitting: Emergency Medicine

## 2015-10-21 DIAGNOSIS — R519 Headache, unspecified: Secondary | ICD-10-CM

## 2015-10-21 DIAGNOSIS — R51 Headache: Secondary | ICD-10-CM | POA: Insufficient documentation

## 2015-10-21 DIAGNOSIS — H5711 Ocular pain, right eye: Secondary | ICD-10-CM | POA: Insufficient documentation

## 2015-10-21 LAB — CBC
HCT: 38.4 % (ref 36.0–46.0)
Hemoglobin: 12.5 g/dL (ref 12.0–15.0)
MCH: 30.9 pg (ref 26.0–34.0)
MCHC: 32.6 g/dL (ref 30.0–36.0)
MCV: 95 fL (ref 78.0–100.0)
PLATELETS: 196 10*3/uL (ref 150–400)
RBC: 4.04 MIL/uL (ref 3.87–5.11)
RDW: 14.3 % (ref 11.5–15.5)
WBC: 8.2 10*3/uL (ref 4.0–10.5)

## 2015-10-21 LAB — COMPREHENSIVE METABOLIC PANEL
ALBUMIN: 3.7 g/dL (ref 3.5–5.0)
ALK PHOS: 44 U/L (ref 38–126)
ALT: 16 U/L (ref 14–54)
ANION GAP: 4 — AB (ref 5–15)
AST: 23 U/L (ref 15–41)
BILIRUBIN TOTAL: 0.4 mg/dL (ref 0.3–1.2)
BUN: 11 mg/dL (ref 6–20)
CALCIUM: 8.9 mg/dL (ref 8.9–10.3)
CO2: 26 mmol/L (ref 22–32)
Chloride: 108 mmol/L (ref 101–111)
Creatinine, Ser: 0.59 mg/dL (ref 0.44–1.00)
GFR calc Af Amer: >60 mL/min (ref >60)
GFR calc non Af Amer: >60 mL/min (ref >60)
GLUCOSE: 94 mg/dL (ref 65–99)
Potassium: 3.9 mmol/L (ref 3.5–5.1)
Sodium: 138 mmol/L (ref 135–145)
TOTAL PROTEIN: 6.5 g/dL (ref 6.5–8.1)

## 2015-10-21 LAB — PREGNANCY, URINE: Preg Test, Ur: NEGATIVE

## 2015-10-21 LAB — URINALYSIS, ROUTINE W REFLEX MICROSCOPIC
BILIRUBIN URINE: NEGATIVE
Glucose, UA: NEGATIVE mg/dL
Hgb urine dipstick: NEGATIVE
Ketones, ur: NEGATIVE mg/dL
Leukocytes, UA: NEGATIVE
NITRITE: NEGATIVE
Protein, ur: NEGATIVE mg/dL
Specific Gravity, Urine: 1.008 (ref 1.005–1.030)
pH: 7.5 (ref 5.0–8.0)

## 2015-10-21 LAB — LIPASE, BLOOD: Lipase: 34 U/L (ref 11–51)

## 2015-10-21 MED ORDER — IBUPROFEN 800 MG PO TABS: 800.0000 mg | ORAL_TABLET | Freq: Three times a day (TID) | ORAL | 0 refills | Status: AC | PRN

## 2015-10-21 MED ORDER — IOPAMIDOL (ISOVUE-300) INJECTION 61%
INTRAVENOUS | Status: AC
  Administered 2015-10-21: 75 mL
  Filled 2015-10-21: qty 75

## 2015-10-21 NOTE — ED Notes (Signed)
Pts IVs out and discharged vitals in.  Pt getting dressed and awaiting RN.

## 2015-10-21 NOTE — ED Triage Notes (Signed)
Pt/translator stated, I woke up N/V with rt. Eye pain this morning.

## 2015-10-21 NOTE — ED Provider Notes (Signed)
[facility]-EMERGENCY DEPT Provider Note   CSN: [id number] Arrival date & time: 10/21/15  9833  First Provider Contact:  None       History   Chief Complaint Chief Complaint  Patient presents with  . Nausea  . Eye Pain  . Emesis    HPI Patricia Tate is a 35 y.o. female.  HPI Patient presents to the emergency department with facial pain that started when she awoke this morning.  Patient, states she has had right-sided facial pain underneath her right eye.  Patient states that seems to radiate around to her right templeThe patient denies chest pain, shortness of breath, headache,blurred vision, neck pain, fever, cough, weakness, numbness, dizziness, anorexia, edema, abdominal pain, nausea, vomiting, diarrhea, rash, back pain, dysuria, hematemesis, bloody stool, near syncope, or syncope.  The patient did take Motrin with relief of her symptoms.  Patient states nothing seems to make her condition worse.  The patient states that she has no visual changes Past Medical History:  Diagnosis Date  . Arthritis   . Kidney stones   . Rheumatoid arthritis of the hand 11/12/2010  . Rheumatoid arthritis(714.0)     Patient Active Problem List   Diagnosis Date Noted  . Chicken pox 01/02/2015  . Varicella-zoster virus infection 01/02/2015  . Rheumatoid arthritis (HCC) 01/02/2015  . Leukopenia 01/02/2015  . Hypokalemia 01/02/2015  . Rheumatoid arthritis of the hand 11/12/2010    Past Surgical History:  Procedure Laterality Date  . CESAREAN SECTION    . TUBAL LIGATION      OB History    Gravida Para Term Preterm AB Living   4 3 1 2   3    SAB TAB Ectopic Multiple Live Births                   Home Medications    Prior to Admission medications   Medication Sig Start Date End Date Taking? Authorizing Provider  acetaminophen (TYLENOL) 325 MG tablet Take 2 tablets (650 mg total) by mouth every 6 (six) hours as needed for mild pain or fever. 01/03/15   01/05/15, MD  FOLIC  ACID PO Take 1 tablet by mouth daily.    Historical Provider, MD  HYDROcodone-acetaminophen (NORCO/VICODIN) 5-325 MG per tablet Take 2 tablets by mouth every 4 (four) hours as needed for pain. Patient not taking: Reported on 01/02/2015 12/02/11   12/04/11, MD  hydroxychloroquine (PLAQUENIL) 200 MG tablet Take 200 mg by mouth 2 (two) times daily.     Historical Provider, MD  ketorolac (TORADOL) 10 MG tablet Take 1 tablet (10 mg total) by mouth 4 (four) times daily as needed. 01/03/15   01/05/15, MD  methotrexate (RHEUMATREX) 2.5 MG tablet Take 6 tablets (15 mg total) by mouth once a week. Caution:Chemotherapy. Protect from light. Takes 6 tablets each Monday.  PLEASE RESUME FROM THE WEEK OF 01/13/15 01/03/15   01/05/15, MD  predniSONE (DELTASONE) 5 MG tablet Take 5 mg by mouth daily.    Historical Provider, MD  Prenatal Multivit-Min-Fe-FA (PRENATAL PO) Take 1 tablet by mouth daily.      Historical Provider, MD  valACYclovir (VALTREX) 1000 MG tablet Take 1 tablet (1,000 mg total) by mouth 3 (three) times daily. For 12 more days 01/03/15   01/05/15, MD    Family History Family History  Problem Relation Age of Onset  . Rheum arthritis Sister     Social History Social History  Substance Use Topics  . Smoking status: Never Smoker  .  Smokeless tobacco: Never Used  . Alcohol use No     Allergies   Tamsulosin   Review of Systems Review of Systems  All other systems negative except as documented in the HPI. All pertinent positives and negatives as reviewed in the HPI. Physical Exam Updated Vital Signs BP 98/73 (BP Location: Right Arm)   Pulse 87   Temp 97.7 F (36.5 C) (Oral)   Resp 14   LMP 10/07/2015   SpO2 97%   Physical Exam  Constitutional: She is oriented to person, place, and time. She appears well-developed and well-nourished. No distress.  HENT:  Head: Normocephalic and atraumatic.  Nose:    Mouth/Throat: Oropharynx is clear and moist.    Eyes: Conjunctivae and EOM are normal. Pupils are equal, round, and reactive to light. Right eye exhibits normal extraocular motion and no nystagmus. Left eye exhibits normal extraocular motion and no nystagmus.  Neck: Normal range of motion. Neck supple.  Cardiovascular: Normal rate, regular rhythm and normal heart sounds.  Exam reveals no gallop and no friction rub.   No murmur heard. Pulmonary/Chest: Effort normal and breath sounds normal. No respiratory distress. She has no wheezes.  Abdominal: Soft. Bowel sounds are normal. She exhibits no distension. There is no tenderness.  Neurological: She is alert and oriented to person, place, and time. She exhibits normal muscle tone. Coordination normal.  Skin: Skin is warm and dry. No rash noted. No erythema.  Psychiatric: She has a normal mood and affect. Her behavior is normal.  Nursing note and vitals reviewed.    ED Treatments / Results  Labs (all labs ordered are listed, but only abnormal results are displayed) Labs Reviewed  COMPREHENSIVE METABOLIC PANEL - Abnormal; Notable for the following:       Result Value   Anion gap 4 (*)    All other components within normal limits  LIPASE, BLOOD  CBC  URINALYSIS, ROUTINE W REFLEX MICROSCOPIC (NOT AT [facility])  PREGNANCY, URINE    EKG  EKG Interpretation None       Radiology Ct Maxillofacial W Contrast  Result Date: 10/21/2015 CLINICAL DATA:  35 year old female with a history of right-sided periorbital pain EXAM: CT MAXILLOFACIAL WITH CONTRAST TECHNIQUE: Multidetector CT imaging of the maxillofacial structures was performed with intravenous contrast. Multiplanar CT image reconstructions were also generated. A small metallic BB was placed on the right temple in order to reliably differentiate right from left. CONTRAST:  59mL ISOVUE-300 IOPAMIDOL (ISOVUE-300) INJECTION 61% COMPARISON:  None. FINDINGS: Mandible: No acute fracture identified. Temporomandibular joints approximated. Evidence of  endodontal disease. Mid face: No acute bony abnormality identified. Paranasal sinuses: Patency of the frontal sinuses, ethmoid air cells, bilateral maxillary sinuses, sphenoid sinuses. Mastoid air cells are clear. No fluid within the middle ear on the left or the right. Symmetry maintained of the bilateral facial soft tissues. No subcutaneous gas. No hematoma. No soft tissue abnormality. Multiple head and neck lymph nodes are present bilaterally, none of which are suspicious by CT size criteria or configuration. Orbits:  Unremarkable appearance the bilateral orbits. Unremarkable appearance the bilateral globes. Specifically, no focal finding of the soft tissues of the right periorbital region. Unremarkable appearance of the visualized intracranial structures. Unremarkable appearance of the visualized craniocervical junction and upper cervical spine IMPRESSION: Unremarkable contrast-enhanced CT of the facial bones. Signed, Yvone Neu. Loreta Ave, DO [facility] Electronically Signed   By: Gilmer Mor D.O.   On: 10/21/2015 11:45    Procedures Procedures (including  critical care time)  Medications Ordered in ED Medications  iopamidol (ISOVUE-300) 61 % injection (75 mLs  Contrast Given 10/21/15 1107)     Initial Impression / Assessment and Plan / ED Course  I have reviewed the triage vital signs and the nursing notes.  Pertinent labs & imaging results that were available during my care of the patient were reviewed by me and considered in my medical decision making (see chart for details).  Clinical Course    Patient be discharged home.  This could be a neuropathic type situation.  She does not have any visual changes.  No neuro deficits.  Patient is advised return here as needed.  Told to increase her fluid intake, use heat below her eye  Final Clinical Impressions(s) / ED Diagnoses   Final diagnoses:  None    New Prescriptions New  Prescriptions   No medications on file     Charlestine Night, PA-C 10/21/15 1315    Azalia Bilis, MD 10/21/15 [phone]

## 2015-10-21 NOTE — Discharge Instructions (Signed)
Return here as needed.  Follow-up with a primary care doctor.  Your testing here today did not show any abnormalities.
